# Patient Record
Sex: Male | Born: 1965 | Race: White | Hispanic: No | Smoking: Current every day smoker
Health system: Southern US, Community
[De-identification: ages and names within clinical notes are randomized; demographics above are authoritative.]

## PROBLEM LIST (undated history)

## (undated) DIAGNOSIS — E7800 Pure hypercholesterolemia, unspecified: Secondary | ICD-10-CM

## (undated) DIAGNOSIS — I10 Essential (primary) hypertension: Secondary | ICD-10-CM

## (undated) DIAGNOSIS — R7303 Prediabetes: Secondary | ICD-10-CM

## (undated) DIAGNOSIS — R768 Other specified abnormal immunological findings in serum: Secondary | ICD-10-CM

## (undated) HISTORY — DX: Other specified abnormal immunological findings in serum: R76.8

---

## 2002-12-16 ENCOUNTER — Emergency Department (HOSPITAL_COMMUNITY): Admission: EM | Admit: 2002-12-16 | Discharge: 2002-12-16 | Payer: Self-pay | Admitting: Emergency Medicine

## 2002-12-16 ENCOUNTER — Encounter: Payer: Self-pay | Admitting: Emergency Medicine

## 2003-02-03 ENCOUNTER — Emergency Department (HOSPITAL_COMMUNITY): Admission: EM | Admit: 2003-02-03 | Discharge: 2003-02-03 | Payer: Self-pay | Admitting: Emergency Medicine

## 2003-02-03 ENCOUNTER — Encounter: Payer: Self-pay | Admitting: Emergency Medicine

## 2004-02-18 ENCOUNTER — Emergency Department (HOSPITAL_COMMUNITY): Admission: EM | Admit: 2004-02-18 | Discharge: 2004-02-18 | Payer: Self-pay | Admitting: Internal Medicine

## 2005-06-22 ENCOUNTER — Emergency Department (HOSPITAL_COMMUNITY): Admission: EM | Admit: 2005-06-22 | Discharge: 2005-06-22 | Payer: Self-pay | Admitting: Emergency Medicine

## 2007-12-31 ENCOUNTER — Emergency Department (HOSPITAL_COMMUNITY): Admission: EM | Admit: 2007-12-31 | Discharge: 2007-12-31 | Payer: Self-pay | Admitting: Emergency Medicine

## 2009-03-01 ENCOUNTER — Emergency Department (HOSPITAL_COMMUNITY): Admission: EM | Admit: 2009-03-01 | Discharge: 2009-03-01 | Payer: Self-pay | Admitting: Emergency Medicine

## 2009-03-03 ENCOUNTER — Emergency Department (HOSPITAL_COMMUNITY): Admission: EM | Admit: 2009-03-03 | Discharge: 2009-03-04 | Payer: Self-pay | Admitting: Emergency Medicine

## 2009-08-15 ENCOUNTER — Emergency Department (HOSPITAL_COMMUNITY): Admission: EM | Admit: 2009-08-15 | Discharge: 2009-08-15 | Payer: Self-pay | Admitting: Emergency Medicine

## 2010-01-16 ENCOUNTER — Emergency Department (HOSPITAL_COMMUNITY): Admission: EM | Admit: 2010-01-16 | Discharge: 2010-01-16 | Payer: Self-pay | Admitting: Emergency Medicine

## 2010-02-10 ENCOUNTER — Emergency Department (HOSPITAL_COMMUNITY): Admission: EM | Admit: 2010-02-10 | Discharge: 2010-02-10 | Payer: Self-pay | Admitting: Emergency Medicine

## 2011-02-06 ENCOUNTER — Emergency Department (HOSPITAL_COMMUNITY)
Admission: EM | Admit: 2011-02-06 | Discharge: 2011-02-06 | Disposition: A | Discharge status: Home / Self Care | Payer: Self-pay | Attending: Emergency Medicine | Admitting: Emergency Medicine

## 2011-02-06 DIAGNOSIS — R109 Unspecified abdominal pain: Secondary | ICD-10-CM | POA: Insufficient documentation

## 2011-02-06 DIAGNOSIS — R197 Diarrhea, unspecified: Secondary | ICD-10-CM | POA: Insufficient documentation

## 2011-02-06 DIAGNOSIS — R10819 Abdominal tenderness, unspecified site: Secondary | ICD-10-CM | POA: Insufficient documentation

## 2011-02-06 LAB — BASIC METABOLIC PANEL
CO2: 25 mEq/L (ref 19–32)
Chloride: 103 mEq/L (ref 96–112)
Creatinine, Ser: 0.93 mg/dL (ref 0.4–1.5)
Glucose, Bld: 88 mg/dL (ref 70–99)
Potassium: 4 mEq/L (ref 3.5–5.1)
Sodium: 139 mEq/L (ref 135–145)

## 2011-02-06 LAB — HEPATIC FUNCTION PANEL
Alkaline Phosphatase: 73 U/L (ref 39–117)
Indirect Bilirubin: 0.7 mg/dL (ref 0.3–0.9)
Total Bilirubin: 0.8 mg/dL (ref 0.3–1.2)

## 2011-02-06 LAB — CBC
HCT: 39.3 % (ref 39.0–52.0)
MCV: 92.7 fL (ref 78.0–100.0)
RBC: 4.24 MIL/uL (ref 4.22–5.81)
RDW: 13.8 % (ref 11.5–15.5)

## 2011-02-06 LAB — URINALYSIS, ROUTINE W REFLEX MICROSCOPIC
Leukocytes, UA: NEGATIVE
Specific Gravity, Urine: >1.03 — ABNORMAL HIGH (ref 1.005–1.030)
pH: 6 (ref 5.0–8.0)

## 2011-02-06 LAB — DIFFERENTIAL
Basophils Relative: 0 % (ref 0–1)
Eosinophils Absolute: 0.2 10*3/uL (ref 0.0–0.7)
Eosinophils Relative: 3 % (ref 0–5)
Lymphocytes Relative: 33 % (ref 12–46)

## 2011-02-06 LAB — LIPASE, BLOOD: Lipase: 16 U/L (ref 11–59)

## 2011-02-10 LAB — STOOL CULTURE

## 2011-03-07 IMAGING — CT CT ABDOMEN W/ CM
2 of 5 series · 16 of 46 positions shown, 18 images · IV contrast (agent unspecified)
Comparison: None

CT ABDOMEN

CLINICAL DATA: Abdominal pain, nausea, vomiting, constipation
history of hepatitis C.

CT ABDOMEN AND PELVIS WITH CONTRAST
TECHNIQUE: Multidetector CT imaging of the abdomen and pelvis was
performed using the standard protocol following bolus
administration of intravenous contrast.
Contrast: 100 ml Wmnipaque-255 IV

[Series 2: abd_pel_with 5.0 b40f · axial · 0.61mm/px · z∈[-661,-241]mm · 13 of 94 slices shown, 15 images]
[im 5/94  soft-tissue]
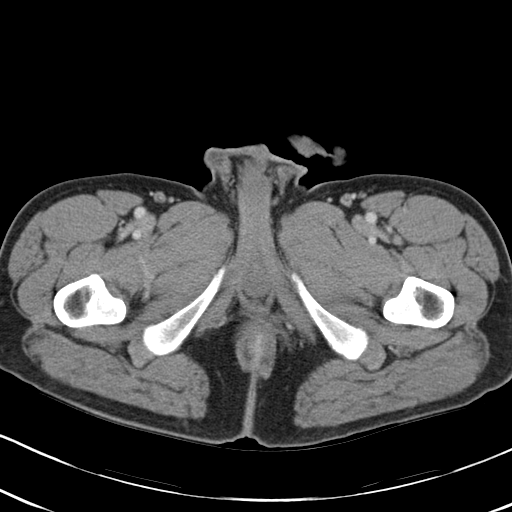
[im 5/94  bone]
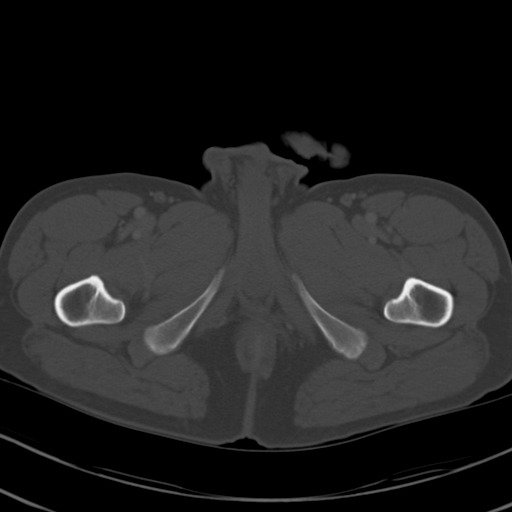
[im 15/94  soft-tissue]
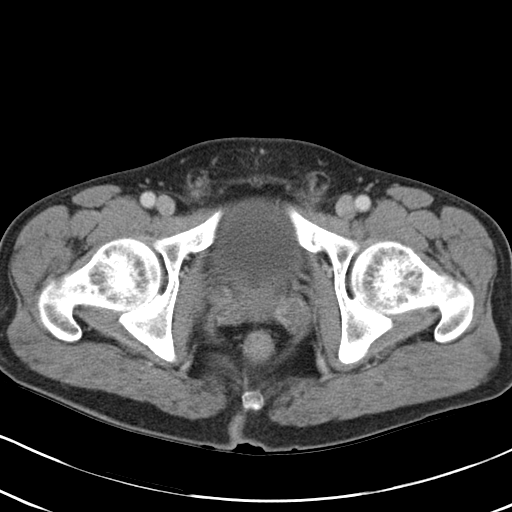
[im 20/94  soft-tissue]
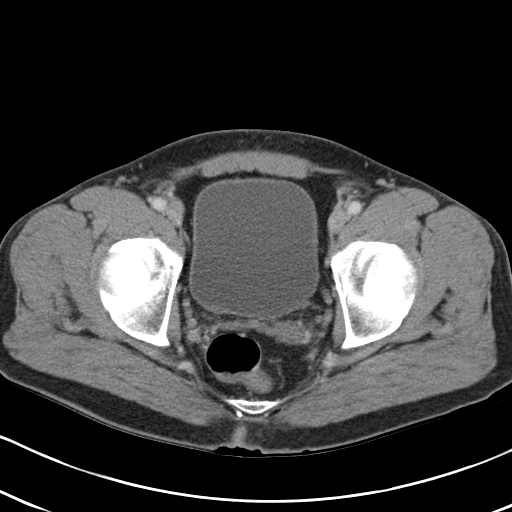
[im 25/94  soft-tissue]
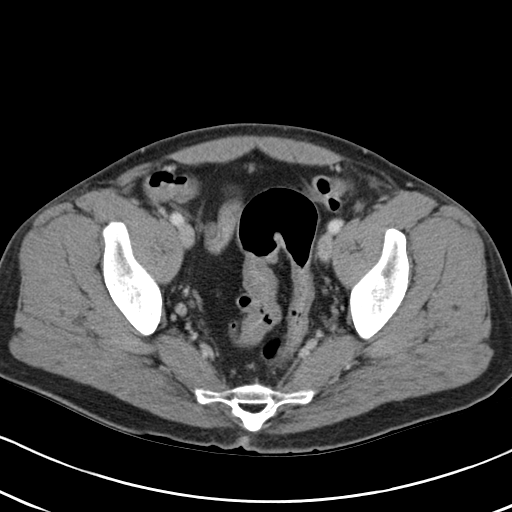
[im 35/94  soft-tissue]
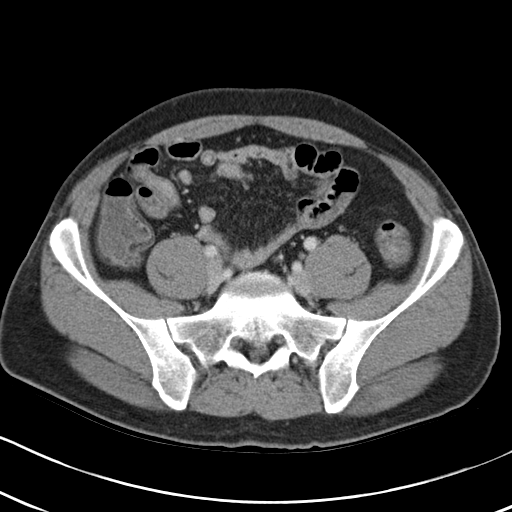
[im 40/94  soft-tissue]
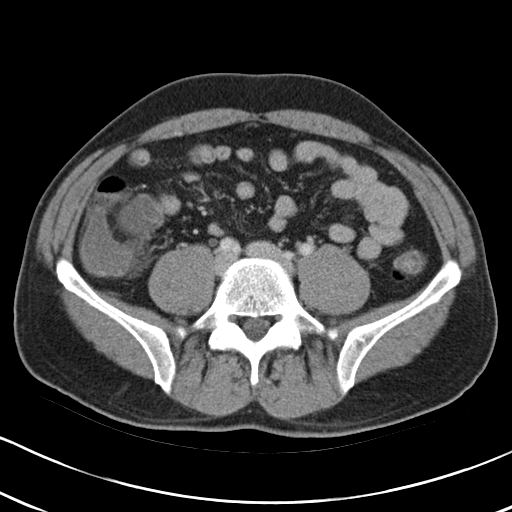
[im 49/94  soft-tissue]
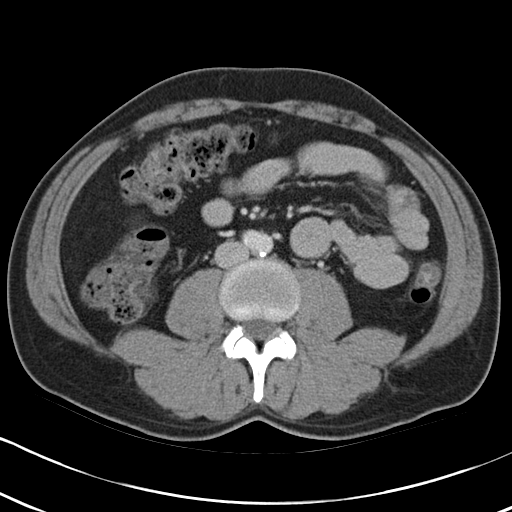
[im 54/94  soft-tissue]
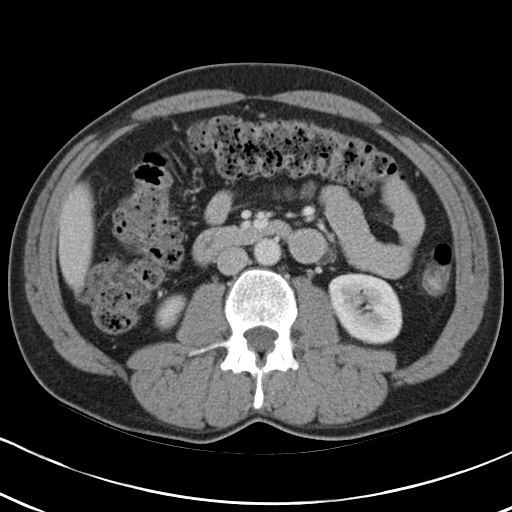
[im 59/94  soft-tissue]
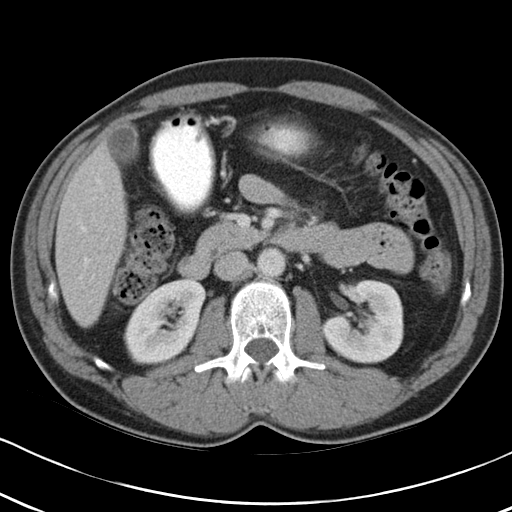
[im 59/94  bone]
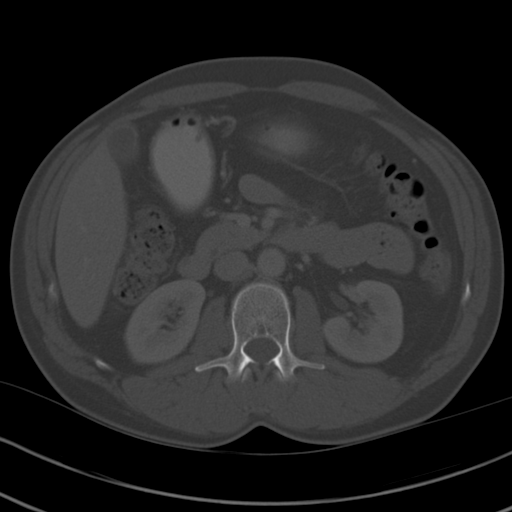
[im 69/94  soft-tissue]
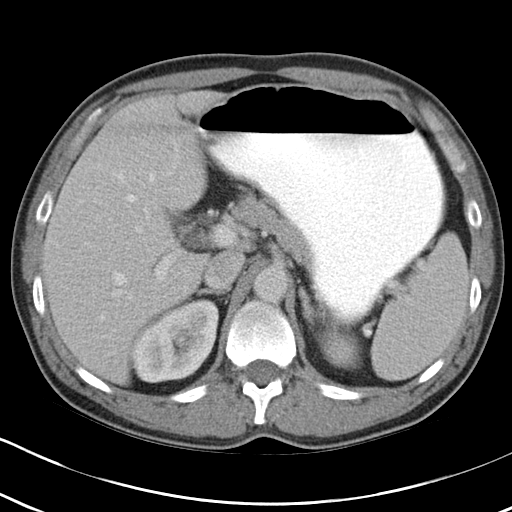
[im 74/94  soft-tissue]
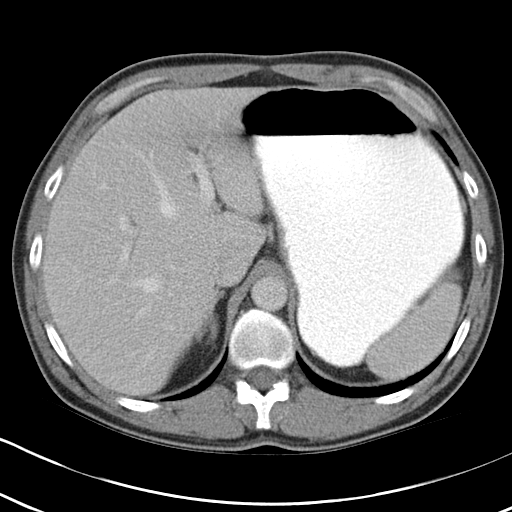
[im 79/94  soft-tissue]
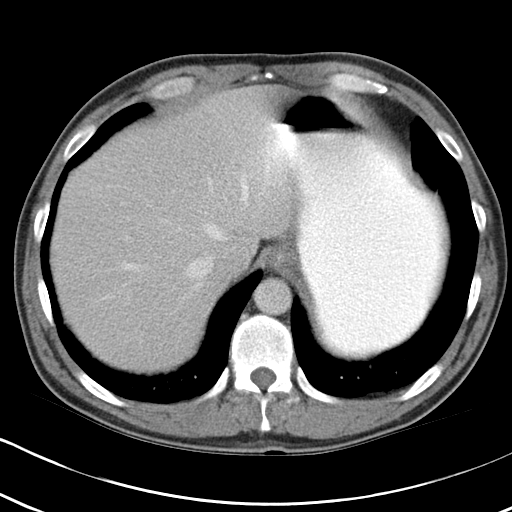
[im 89/94  soft-tissue]
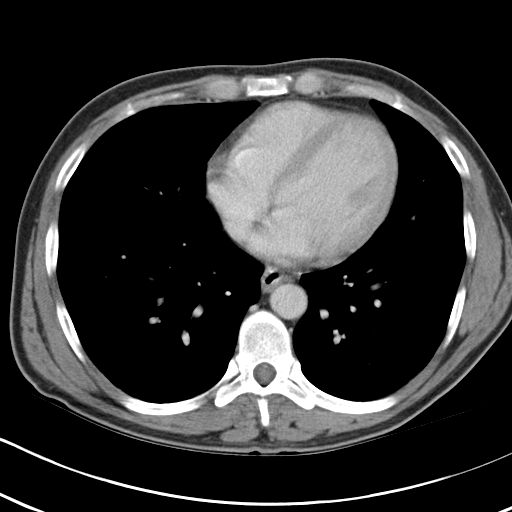

[Series 4: mpr cor post contrast (id) · coronal · 0.64mm/px · 3 of 70 slices shown]
[im 24/70  soft-tissue]
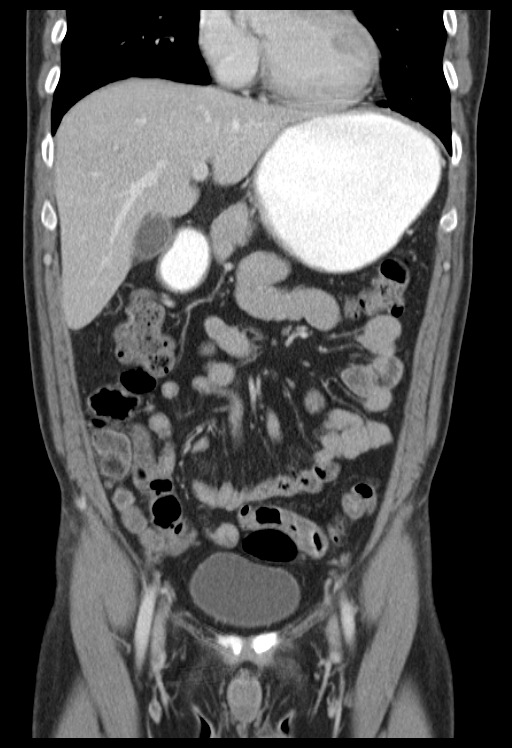
[im 31/70  soft-tissue]
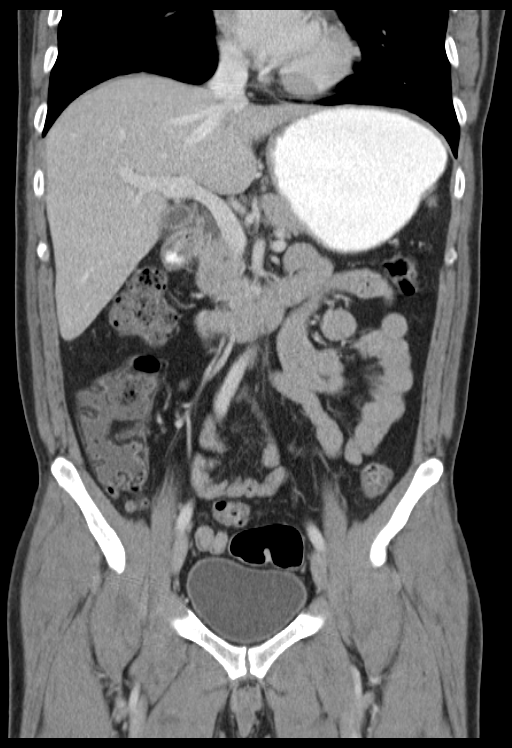
[im 39/70  soft-tissue]
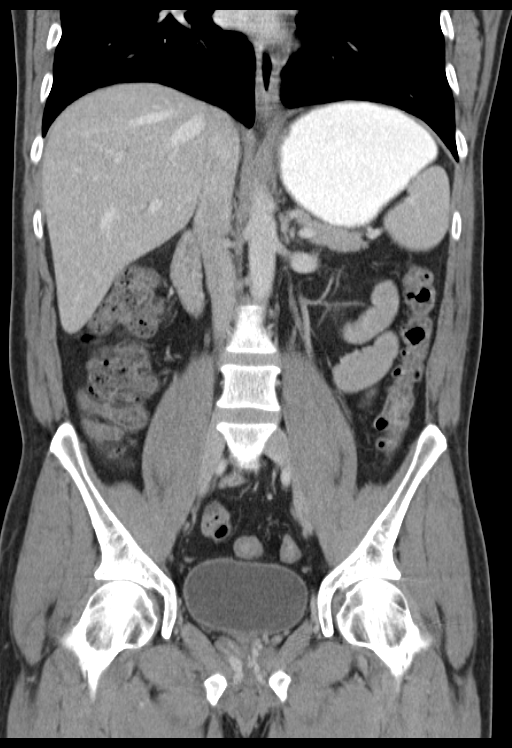

[16 of 46 positions shown; findings below may reference images not displayed]

FINDINGS: Increased stool burden in the colon particularly the
right colon.  No unusual bowel wall thickening.  No free fluid or
free air.Normal appendix.  Negative for bowel obstruction.  Normal
gallbladder.  Liver spleen, pancreas, kidneys, adrenal glands
appear unremarkable.
IMPRESSION: Increase stool burden.  No acute findings.

CT PELVIS
FINDINGS: No pelvic mass or free fluid.  Rectosigmoid
unremarkable.  There are few sigmoid colon diverticula but no
evidence of diverticulitis.
IMPRESSION: Unremarkable CT pelvis.

## 2011-03-09 LAB — CBC
Hemoglobin: 15.3 g/dL (ref 13.0–17.0)
MCHC: 34.6 g/dL (ref 30.0–36.0)
MCV: 94.1 fL (ref 78.0–100.0)
Platelets: 294 10*3/uL (ref 150–400)
RBC: 4.71 MIL/uL (ref 4.22–5.81)
RDW: 13.7 % (ref 11.5–15.5)
WBC: 11.9 10*3/uL — ABNORMAL HIGH (ref 4.0–10.5)

## 2011-03-09 LAB — COMPREHENSIVE METABOLIC PANEL
AST: 19 U/L (ref 0–37)
Alkaline Phosphatase: 99 U/L (ref 39–117)
BUN: 9 mg/dL (ref 6–23)
Chloride: 97 mEq/L (ref 96–112)
Creatinine, Ser: 0.96 mg/dL (ref 0.4–1.5)
Glucose, Bld: 134 mg/dL — ABNORMAL HIGH (ref 70–99)

## 2011-03-09 LAB — URINALYSIS, ROUTINE W REFLEX MICROSCOPIC
Glucose, UA: NEGATIVE mg/dL
Hgb urine dipstick: NEGATIVE
Ketones, ur: NEGATIVE mg/dL
Nitrite: NEGATIVE
Specific Gravity, Urine: 1.01 (ref 1.005–1.030)

## 2011-03-09 LAB — DIFFERENTIAL
Basophils Relative: 0 % (ref 0–1)
Lymphs Abs: 2.3 10*3/uL (ref 0.7–4.0)
Monocytes Absolute: 0.6 10*3/uL (ref 0.1–1.0)
Monocytes Relative: 5 % (ref 3–12)
Neutro Abs: 8.8 10*3/uL — ABNORMAL HIGH (ref 1.7–7.7)

## 2011-03-14 LAB — COMPREHENSIVE METABOLIC PANEL
ALT: 16 U/L (ref 0–53)
AST: 20 U/L (ref 0–37)
Albumin: 4.1 g/dL (ref 3.5–5.2)
Alkaline Phosphatase: 93 U/L (ref 39–117)
Calcium: 9.3 mg/dL (ref 8.4–10.5)
GFR calc non Af Amer: >60 mL/min (ref >60)
Glucose, Bld: 122 mg/dL — ABNORMAL HIGH (ref 70–99)
Sodium: 136 mEq/L (ref 135–145)
Total Bilirubin: 0.6 mg/dL (ref 0.3–1.2)
Total Protein: 7.1 g/dL (ref 6.0–8.3)

## 2011-03-14 LAB — CBC
HCT: 43.2 % (ref 39.0–52.0)
MCHC: 34.2 g/dL (ref 30.0–36.0)
MCV: 93.9 fL (ref 78.0–100.0)
Platelets: 296 10*3/uL (ref 150–400)
RDW: 13.6 % (ref 11.5–15.5)

## 2011-03-14 LAB — DIFFERENTIAL
Basophils Absolute: 0.1 10*3/uL (ref 0.0–0.1)
Basophils Relative: 1 % (ref 0–1)
Eosinophils Absolute: 0.2 10*3/uL (ref 0.0–0.7)
Neutro Abs: 7.3 10*3/uL (ref 1.7–7.7)
Neutrophils Relative %: 69 % (ref 43–77)

## 2011-03-14 LAB — URINALYSIS, ROUTINE W REFLEX MICROSCOPIC
Hgb urine dipstick: NEGATIVE
Protein, ur: NEGATIVE mg/dL
Specific Gravity, Urine: 1.02 (ref 1.005–1.030)
pH: 7 (ref 5.0–8.0)

## 2011-03-15 LAB — URINALYSIS, ROUTINE W REFLEX MICROSCOPIC
Glucose, UA: NEGATIVE mg/dL
Protein, ur: NEGATIVE mg/dL
pH: 7 (ref 5.0–8.0)

## 2011-03-15 LAB — URINE CULTURE
Colony Count: NO GROWTH
Culture: NO GROWTH

## 2011-03-15 LAB — CBC
HCT: 44.8 % (ref 39.0–52.0)
Hemoglobin: 15.3 g/dL (ref 13.0–17.0)
MCHC: 34.2 g/dL (ref 30.0–36.0)
RDW: 13.9 % (ref 11.5–15.5)

## 2011-03-15 LAB — POCT CARDIAC MARKERS: CKMB, poc: <1 ng/mL — ABNORMAL LOW (ref 1.0–8.0)

## 2011-03-15 LAB — COMPREHENSIVE METABOLIC PANEL
BUN: 6 mg/dL (ref 6–23)
Calcium: 8.4 mg/dL (ref 8.4–10.5)
Glucose, Bld: 102 mg/dL — ABNORMAL HIGH (ref 70–99)
Total Protein: 5.9 g/dL — ABNORMAL LOW (ref 6.0–8.3)

## 2011-03-15 LAB — DIFFERENTIAL
Lymphs Abs: 3.6 10*3/uL (ref 0.7–4.0)
Monocytes Relative: 5 % (ref 3–12)
Neutro Abs: 7.8 10*3/uL — ABNORMAL HIGH (ref 1.7–7.7)
Neutrophils Relative %: 62 % (ref 43–77)

## 2015-06-15 ENCOUNTER — Telehealth: Payer: Self-pay | Admitting: General Practice

## 2015-06-23 NOTE — Telephone Encounter (Signed)
Patient has [place]. Patient is not on any medications. Appointment given for 07/07/2015 @ 1:25pm.

## 2015-07-07 ENCOUNTER — Encounter: Payer: Self-pay | Admitting: Family Medicine

## 2015-07-07 ENCOUNTER — Encounter (INDEPENDENT_AMBULATORY_CARE_PROVIDER_SITE_OTHER): Payer: Self-pay

## 2015-07-07 ENCOUNTER — Ambulatory Visit (INDEPENDENT_AMBULATORY_CARE_PROVIDER_SITE_OTHER): Payer: PRIVATE HEALTH INSURANCE | Admitting: Family Medicine

## 2015-07-07 VITALS — BP 155/99 | HR 82 | Temp 97.5°F | Ht 66.0 in | Wt 150.4 lb

## 2015-07-07 DIAGNOSIS — I10 Essential (primary) hypertension: Secondary | ICD-10-CM | POA: Insufficient documentation

## 2015-07-07 DIAGNOSIS — R894 Abnormal immunological findings in specimens from other organs, systems and tissues: Secondary | ICD-10-CM

## 2015-07-07 DIAGNOSIS — Z720 Tobacco use: Secondary | ICD-10-CM | POA: Diagnosis not present

## 2015-07-07 DIAGNOSIS — R768 Other specified abnormal immunological findings in serum: Secondary | ICD-10-CM

## 2015-07-07 DIAGNOSIS — Z0000 Encounter for general adult medical examination without abnormal findings: Secondary | ICD-10-CM

## 2015-07-07 DIAGNOSIS — R030 Elevated blood-pressure reading, without diagnosis of hypertension: Secondary | ICD-10-CM

## 2015-07-07 NOTE — Assessment & Plan Note (Signed)
Unsure if he has had high blood pressure in the past, back in one month with a long and recheck Likely start Prinzide then

## 2015-07-07 NOTE — Assessment & Plan Note (Signed)
Counseled to quit, offered clinical pharmacist and pharmacologic support Patient to think about it

## 2015-07-07 NOTE — Assessment & Plan Note (Signed)
Relatively healthy 49 year old male, discussed preventative healthcare maintenance and smoking cessation.

## 2015-07-07 NOTE — Assessment & Plan Note (Signed)
Father died of colon cancer, several her cancer related deaths in the family that he doesn't know what type Colonoscopy, GI referral ordered HIV test-screening

## 2015-07-07 NOTE — Patient Instructions (Addendum)
Great to meet you!  Lets follow up in 1 month to discuss your blood pressure, try to write down a few numbers for me this month.   We will let you know about your labs  You should hear from [place] in the next week or so about a colonoscopy

## 2015-07-07 NOTE — Progress Notes (Signed)
Patient ID: George Estes, male   DOB: 1966-08-07, 49 y.o.   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 He has been divorced for about 13 years. Occasionally he has crying spells and he feels depressed for a few hours at a time 1-2 times per week.  Mood As above has crying spells, also endorses decreased sleep, anhedonia, depressed feelings When discussed these feelings and episodes come on and last 30 minutes to a few hours and happen 1-2 times per week. He denies suicidal ideation He would not like to take medications for this at this time.  Tobacco use Current pack a day smoker Has tried to quit before, was unsuccessful  Hepatitis C antibody positive, he states this was done at [facility]. He has never had further workup.  PMH: Smoking status noted Past family, surgical, and social history reviewed and updated in relevant portions of EMR   ROS:   No chest pain, dyspnea, palpitations No leg edema Positive psych symptoms as above Otherwise negative    Objective: BP 155/99 mmHg  Pulse 82  Temp(Src) 97.5 F (36.4 C) (Oral)  Ht 5' 6"  (1.676 m)  Wt 150 lb 6.4 oz (68.221 kg)  BMI 24.29 kg/m2 Gen: NAD, alert, cooperative with exam HEENT: NCAT, TM 70 and LDL, oropharynx clear, nares clear Neck supple, no lymphadenopathy CV: RRR, good S1/S2, no murmur Resp: CTABL, no wheezes, non-labored Abd: SNTND, BS present, no guarding or organomegaly Ext: No edema, warm Neuro: Alert and oriented, No gross deficits Rectal: He declines Skin: No rash  Assessment and plan:  Tobacco abuse Counseled to quit, offered clinical pharmacist and pharmacologic support Patient to think about it  Healthcare maintenance Father died of colon cancer, several her cancer related deaths in the family that he doesn't know what type Colonoscopy, GI referral ordered HIV test-screening   Elevated blood  pressure reading without diagnosis of hypertension Unsure if he has had high blood pressure in the past, back in one month with a long and recheck Likely start Prinzide then  Annual physical exam Relatively healthy 49 year old male, discussed preventative healthcare maintenance and smoking cessation.  Hepatitis C antibody test positive He states that he is hepatitis C antibody positive, however can't verify this is a cannot pull up his chart in care everywhere. Recheck antibody with reflex RNA Quant   Orders Placed This Encounter  Procedures  . CBC with Differential  . CMP14+EGFR  . Hepatitis C Ab Reflex HCV RNA, QUANT  . HIV antibody  . Lipid Panel  . Ambulatory referral to Gastroenterology    Referral Priority:  Routine    Referral Type:  Consultation    Referral Reason:  Specialty Services Required    Number of Visits Requested:  1

## 2015-07-07 NOTE — Assessment & Plan Note (Signed)
He states that he is hepatitis C antibody positive, however can't verify this is a cannot pull up his chart in care everywhere. Recheck antibody with reflex RNA Quant

## 2015-07-11 ENCOUNTER — Encounter (INDEPENDENT_AMBULATORY_CARE_PROVIDER_SITE_OTHER): Payer: Self-pay | Admitting: *Deleted

## 2015-07-13 LAB — CMP14+EGFR
ALK PHOS: 98 IU/L (ref 39–117)
ALT: 30 IU/L (ref 0–44)
AST: 17 IU/L (ref 0–40)
Albumin/Globulin Ratio: 1.8 (ref 1.1–2.5)
Albumin: 4.5 g/dL (ref 3.5–5.5)
BUN/Creatinine Ratio: 12 (ref 9–20)
BUN: 9 mg/dL (ref 6–24)
Bilirubin Total: 0.6 mg/dL (ref 0.0–1.2)
CALCIUM: 9 mg/dL (ref 8.7–10.2)
CHLORIDE: 98 mmol/L (ref 97–108)
CO2: 21 mmol/L (ref 18–29)
CREATININE: 0.77 mg/dL (ref 0.76–1.27)
GFR calc Af Amer: 122 mL/min/{1.73_m2} (ref >59)
GFR calc non Af Amer: 106 mL/min/{1.73_m2} (ref >59)
GLOBULIN, TOTAL: 2.5 g/dL (ref 1.5–4.5)
Glucose: 92 mg/dL (ref 65–99)
POTASSIUM: 3.7 mmol/L (ref 3.5–5.2)
SODIUM: 139 mmol/L (ref 134–144)
Total Protein: 7 g/dL (ref 6.0–8.5)

## 2015-07-13 LAB — CBC WITH DIFFERENTIAL/PLATELET
BASOS: 0 %
Basophils Absolute: 0 10*3/uL (ref 0.0–0.2)
EOS (ABSOLUTE): 0.1 10*3/uL (ref 0.0–0.4)
EOS: 2 %
HEMATOCRIT: 42.8 % (ref 37.5–51.0)
Hemoglobin: 14.7 g/dL (ref 12.6–17.7)
IMMATURE GRANS (ABS): 0 10*3/uL (ref 0.0–0.1)
Immature Granulocytes: 0 %
LYMPHS ABS: 2.3 10*3/uL (ref 0.7–3.1)
LYMPHS: 29 %
MCH: 32.2 pg (ref 26.6–33.0)
MCHC: 34.3 g/dL (ref 31.5–35.7)
MCV: 94 fL (ref 79–97)
MONOCYTES: 6 %
Monocytes Absolute: 0.5 10*3/uL (ref 0.1–0.9)
NEUTROS ABS: 4.8 10*3/uL (ref 1.4–7.0)
NEUTROS PCT: 63 %
Platelets: 271 10*3/uL (ref 150–379)
RBC: 4.57 x10E6/uL (ref 4.14–5.80)
RDW: 14.2 % (ref 12.3–15.4)
WBC: 7.8 10*3/uL (ref 3.4–10.8)

## 2015-07-13 LAB — LIPID PANEL
CHOLESTEROL TOTAL: 248 mg/dL — AB (ref 100–199)
Chol/HDL Ratio: 6.5 ratio units — ABNORMAL HIGH (ref 0.0–5.0)
HDL: 38 mg/dL — ABNORMAL LOW (ref >39)
LDL CALC: 147 mg/dL — AB (ref 0–99)
Triglycerides: 313 mg/dL — ABNORMAL HIGH (ref 0–149)
VLDL Cholesterol Cal: 63 mg/dL — ABNORMAL HIGH (ref 5–40)

## 2015-07-13 LAB — HCV RNA PCR QN RFX NS3/4A: HEPATITIS C QUANTITATION: NOT DETECTED [IU]/mL

## 2015-07-13 LAB — HIV ANTIBODY (ROUTINE TESTING W REFLEX): HIV SCREEN 4TH GENERATION: NONREACTIVE

## 2015-08-01 ENCOUNTER — Telehealth: Payer: Self-pay | Admitting: Family Medicine

## 2015-08-01 NOTE — Telephone Encounter (Addendum)
Left message to return call. His hep C result was negative so I'm not sure why he's concerned.

## 2015-08-09 ENCOUNTER — Other Ambulatory Visit (INDEPENDENT_AMBULATORY_CARE_PROVIDER_SITE_OTHER): Payer: Self-pay | Admitting: *Deleted

## 2015-08-09 DIAGNOSIS — Z800 Family history of malignant neoplasm of digestive organs: Secondary | ICD-10-CM

## 2015-08-09 DIAGNOSIS — Z1211 Encounter for screening for malignant neoplasm of colon: Secondary | ICD-10-CM

## 2015-08-11 ENCOUNTER — Ambulatory Visit (INDEPENDENT_AMBULATORY_CARE_PROVIDER_SITE_OTHER): Payer: PRIVATE HEALTH INSURANCE | Admitting: Family Medicine

## 2015-08-11 ENCOUNTER — Encounter: Payer: Self-pay | Admitting: Family Medicine

## 2015-08-11 VITALS — BP 164/96 | HR 82 | Temp 97.2°F | Ht 66.0 in | Wt 147.8 lb

## 2015-08-11 DIAGNOSIS — R768 Other specified abnormal immunological findings in serum: Secondary | ICD-10-CM

## 2015-08-11 DIAGNOSIS — I10 Essential (primary) hypertension: Secondary | ICD-10-CM

## 2015-08-11 DIAGNOSIS — R1013 Epigastric pain: Secondary | ICD-10-CM | POA: Insufficient documentation

## 2015-08-11 DIAGNOSIS — E785 Hyperlipidemia, unspecified: Secondary | ICD-10-CM

## 2015-08-11 DIAGNOSIS — R894 Abnormal immunological findings in specimens from other organs, systems and tissues: Secondary | ICD-10-CM | POA: Diagnosis not present

## 2015-08-11 MED ORDER — ATORVASTATIN CALCIUM 20 MG PO TABS: 20.0000 mg | ORAL_TABLET | Freq: Every day | ORAL | Status: DC

## 2015-08-11 MED ORDER — AMLODIPINE BESYLATE 5 MG PO TABS: 5.0000 mg | ORAL_TABLET | Freq: Every day | ORAL | Status: DC

## 2015-08-11 NOTE — Patient Instructions (Signed)
Great to see you!  Come back in 3 months to re-check your cholesterol and see how you are doing

## 2015-08-11 NOTE — Progress Notes (Signed)
   HPI  Patient presents today for follow-up blood pressure, epigastric pain, hyperlipidemia  Blood pressure He is not able to check his blood pressure home No chest pain, dyspnea, palpitations, leg edema.  Hyperlipidemia His cholesterol came back to be elevated, we discussed the side effects of statins and watching his diet. He is not confident that he can make good diet changes and would like to start statin.  Epigastric pain. He has intermittent episodes the last several days of sharp epigastric abdominal pain worse with movement Unsure if it's worsened with eating He does have loose stools during these episodes but does not have overt diarrhea. Denies drinking alcohol, however he does persistently smoke. No alleviating factors besides pushing down in his epigastric area  PMH: Smoking status noted ROS: Per HPI  Objective: BP 164/96 mmHg  Pulse 82  Temp(Src) 97.2 F (36.2 C) (Oral)  Ht 5\' 6"  (1.676 m)  Wt 147 lb 12.8 oz (67.042 kg)  BMI 23.87 kg/m2 Gen: NAD, alert, cooperative with exam HEENT: NCAT CV: RRR, good S1/S2, no murmur Resp: CTABL, no wheezes, non-labored Abd: Mild right upper quadrant and midepigastric tenderness to palpation, positive bowel sounds, no scars, no guarding Ext: No edema, warm Neuro: Alert and oriented, No gross deficits  Assessment and plan:  # Elevated blood pressure, hypertension Now measured on 2 different occasions, even elevated after waiting 10 minutes and the room. Start amlodipine He works in a warehouse and frequently gets dehydrated so will avoid diuretics as well as ace inhibitors for now.  # Hyperlipidemia Discussed diet Start statin Recheck lipids and liver function in 3 months  # Hepatitis C antibody Reports previous positive tests, however our test was negative Discussed, monitor liver function annually   #Epigastric pain Labs normal, previous workup for similar episodes have included ultrasound and a CT scan at other  institutions which were negative. Considering location I do suspect pancreatitis, however he does not drink alcohol. Check lipase with recent episode Colonoscopy scheduled  Orders Placed This Encounter  Procedures  . Lipase    Meds ordered this encounter  Medications  . amLODipine (NORVASC) 5 MG tablet    Sig: Take 1 tablet (5 mg total) by mouth daily.    Dispense:  90 tablet    Refill:  3  . atorvastatin (LIPITOR) 20 MG tablet    Sig: Take 1 tablet (20 mg total) by mouth daily.    Dispense:  90 tablet    Refill:  [place], MD [facility] 08/11/2015, 5:09 PM

## 2015-08-18 NOTE — Telephone Encounter (Signed)
Pt's concerns discussed at OV of 08/11/2015

## 2015-08-18 NOTE — Telephone Encounter (Signed)
Pt has been seen since this

## 2015-09-16 ENCOUNTER — Encounter (HOSPITAL_COMMUNITY): Payer: Self-pay | Admitting: Emergency Medicine

## 2015-09-16 ENCOUNTER — Emergency Department (HOSPITAL_COMMUNITY): Payer: PRIVATE HEALTH INSURANCE

## 2015-09-16 ENCOUNTER — Emergency Department (HOSPITAL_COMMUNITY)
Admission: EM | Admit: 2015-09-16 | Discharge: 2015-09-16 | Disposition: A | Discharge status: Home / Self Care | Payer: PRIVATE HEALTH INSURANCE | Attending: Emergency Medicine | Admitting: Emergency Medicine

## 2015-09-16 DIAGNOSIS — Z720 Tobacco use: Secondary | ICD-10-CM | POA: Insufficient documentation

## 2015-09-16 DIAGNOSIS — R202 Paresthesia of skin: Secondary | ICD-10-CM

## 2015-09-16 DIAGNOSIS — Y998 Other external cause status: Secondary | ICD-10-CM | POA: Diagnosis not present

## 2015-09-16 DIAGNOSIS — Y9289 Other specified places as the place of occurrence of the external cause: Secondary | ICD-10-CM | POA: Insufficient documentation

## 2015-09-16 DIAGNOSIS — Y9389 Activity, other specified: Secondary | ICD-10-CM | POA: Diagnosis not present

## 2015-09-16 DIAGNOSIS — W108XXA Fall (on) (from) other stairs and steps, initial encounter: Secondary | ICD-10-CM | POA: Insufficient documentation

## 2015-09-16 DIAGNOSIS — E7800 Pure hypercholesterolemia, unspecified: Secondary | ICD-10-CM | POA: Insufficient documentation

## 2015-09-16 DIAGNOSIS — Z8619 Personal history of other infectious and parasitic diseases: Secondary | ICD-10-CM | POA: Diagnosis not present

## 2015-09-16 DIAGNOSIS — R200 Anesthesia of skin: Secondary | ICD-10-CM | POA: Insufficient documentation

## 2015-09-16 DIAGNOSIS — S3992XA Unspecified injury of lower back, initial encounter: Secondary | ICD-10-CM | POA: Diagnosis not present

## 2015-09-16 DIAGNOSIS — M545 Low back pain: Secondary | ICD-10-CM

## 2015-09-16 DIAGNOSIS — I10 Essential (primary) hypertension: Secondary | ICD-10-CM | POA: Diagnosis not present

## 2015-09-16 DIAGNOSIS — W19XXXA Unspecified fall, initial encounter: Secondary | ICD-10-CM

## 2015-09-16 DIAGNOSIS — Z79899 Other long term (current) drug therapy: Secondary | ICD-10-CM | POA: Diagnosis not present

## 2015-09-16 HISTORY — DX: Pure hypercholesterolemia, unspecified: E78.00

## 2015-09-16 HISTORY — DX: Essential (primary) hypertension: I10

## 2015-09-16 MED ORDER — PREDNISONE 10 MG PO TABS: ORAL_TABLET | ORAL | Status: DC

## 2015-09-16 MED ORDER — HYDROCODONE-ACETAMINOPHEN 5-325 MG PO TABS: 1.0000 | ORAL_TABLET | ORAL | Status: DC | PRN

## 2015-09-16 MED ORDER — PREDNISONE 10 MG PO TABS
60.0000 mg | ORAL_TABLET | Freq: Once | ORAL | Status: AC
  Administered 2015-09-16: 60 mg via ORAL
  Filled 2015-09-16 (×2): qty 1

## 2015-09-16 MED ORDER — CYCLOBENZAPRINE HCL 5 MG PO TABS
5.0000 mg | ORAL_TABLET | Freq: Three times a day (TID) | ORAL | Status: DC | PRN
Start: 2015-09-16 — End: 2016-01-16

## 2015-09-16 NOTE — Discharge Instructions (Signed)
Take your next dose of prednisone tomorrow morning.  May use the other medications as prescribed, these may make you drowsy, do not drive within 4 hours of taking hydrocodone or Flexeril.  Apply ice as much as possible to your lower back for the next 2 days.  You may add a heating pad starting on Monday 20 minutes 3 times daily.  As discussed you should get immediate recheck for any weakness in either lower extremity.  He should also get rechecked if the numbness is not improving over the next several days as you're inflammation is treated.  Please plan follow-up with your primary doctor for any persistent symptoms.  Return here over the weekend if you have any worsening symptoms, specifically weakness as discussed.   Paresthesia Paresthesia is an abnormal burning or prickling sensation. This sensation is generally felt in the hands, arms, legs, or feet. However, it may occur in any part of the body. Usually, it is not painful. The feeling may be described as:  Tingling or numbness.  Pins and needles.  Skin crawling.  Buzzing.  Limbs falling asleep.  Itching. Most people experience temporary (transient) paresthesia at some time in their lives. Paresthesia may occur when you breathe too quickly (hyperventilation). It can also occur without any apparent cause. Commonly, paresthesia occurs when pressure is placed on a nerve. The sensation quickly goes away after the pressure is removed. For some people, however, paresthesia is a long-lasting (chronic) condition that is caused by an underlying disorder. If you continue to have paresthesia, you may need further medical evaluation. HOME CARE INSTRUCTIONS Watch your condition for any changes. Taking the following actions may help to lessen any discomfort that you are feeling:  Avoid drinking alcohol.  Try acupuncture or massage to help relieve your symptoms.  Keep all follow-up visits as directed by your health care provider. This is  important. SEEK MEDICAL CARE IF:  You continue to have episodes of paresthesia.  Your burning or prickling feeling gets worse when you walk.  You have pain, cramps, or dizziness.  You develop a rash. SEEK IMMEDIATE MEDICAL CARE IF:  You feel weak.  You have trouble walking or moving.  You have problems with speech, understanding, or vision.  You feel confused.  You cannot control your bladder or bowel movements.  You have numbness after an injury.  You faint.   This information is not intended to replace advice given to you by your health care provider. Make sure you discuss any questions you have with your health care provider.   Document Released: 11/09/2002 Document Revised: 04/05/2015 Document Reviewed: 11/15/2014 Elsevier Interactive Patient Education 2016 Elsevier Inc.  Back Pain, Adult Back pain is very common in adults.The cause of back pain is rarely dangerous and the pain often gets better over time.The cause of your back pain may not be known. Some common causes of back pain include:  Strain of the muscles or ligaments supporting the spine.  Wear and tear (degeneration) of the spinal disks.  Arthritis.  Direct injury to the back. For many people, back pain may return. Since back pain is rarely dangerous, most people can learn to manage this condition on their own. HOME CARE INSTRUCTIONS Watch your back pain for any changes. The following actions may help to lessen any discomfort you are feeling:  Remain active. It is stressful on your back to sit or stand in one place for long periods of time. Do not sit, drive, or stand in one place for  more than 30 minutes at a time. Take short walks on even surfaces as soon as you are able.Try to increase the length of time you walk each day.  Exercise regularly as directed by your health care provider. Exercise helps your back heal faster. It also helps avoid future injury by keeping your muscles strong and  flexible.  Do not stay in bed.Resting more than 1-2 days can delay your recovery.  Pay attention to your body when you bend and lift. The most comfortable positions are those that put less stress on your recovering back. Always use proper lifting techniques, including:  Bending your knees.  Keeping the load close to your body.  Avoiding twisting.  Find a comfortable position to sleep. Use a firm mattress and lie on your side with your knees slightly bent. If you lie on your back, put a pillow under your knees.  Avoid feeling anxious or stressed.Stress increases muscle tension and can worsen back pain.It is important to recognize when you are anxious or stressed and learn ways to manage it, such as with exercise.  Take medicines only as directed by your health care provider. Over-the-counter medicines to reduce pain and inflammation are often the most helpful.Your health care provider may prescribe muscle relaxant drugs.These medicines help dull your pain so you can more quickly return to your normal activities and healthy exercise.  Apply ice to the injured area:  Put ice in a plastic bag.  Place a towel between your skin and the bag.  Leave the ice on for 20 minutes, 2-3 times a day for the first 2-3 days. After that, ice and heat may be alternated to reduce pain and spasms.  Maintain a healthy weight. Excess weight puts extra stress on your back and makes it difficult to maintain good posture. SEEK MEDICAL CARE IF:  You have pain that is not relieved with rest or medicine.  You have increasing pain going down into the legs or buttocks.  You have pain that does not improve in one week.  You have night pain.  You lose weight.  You have a fever or chills. SEEK IMMEDIATE MEDICAL CARE IF:   You develop new bowel or bladder control problems.  You have unusual weakness or numbness in your arms or legs.  You develop nausea or vomiting.  You develop abdominal  pain.  You feel faint.   This information is not intended to replace advice given to you by your health care provider. Make sure you discuss any questions you have with your health care provider.   Document Released: 11/19/2005 Document Revised: 12/10/2014 Document Reviewed: 03/23/2014 Elsevier Interactive Patient Education [employer].

## 2015-09-16 NOTE — ED Notes (Signed)
Pt c/o mid back pain after slipping and falling on porch steps. Pt ambulatory in triage. nad noted.

## 2015-09-17 NOTE — ED Provider Notes (Signed)
CSN: [id number]     Arrival date & time 09/16/15  1343 History   First MD Initiated Contact with Patient 09/16/15 1442     Chief Complaint  Patient presents with  . Back Pain     (Consider location/radiation/quality/duration/timing/severity/associated sxs/prior Treatment) The history is provided by the patient.   George Estes is a 49 y.o. male  Presenting with persistent pain in his mid to lower back after tripping down a 5 step outside stoop, landing in grass directly on his lower back several hours before arriving here. He reports resting and using ice without relief of pain which is worsened with movement and palpation.  He also notes a tingling/numb sensation to his left lateral upper leg, but denies weakness with weight bearing or any urinary or fecal incontinence.  He denies hitting his head or loc,denies nausea, vomiting, dizziness or confusion since the event.  He denies history of back pain.    Past Medical History  Diagnosis Date  . Hepatitis C antibody test positive   . Hypertension   . High cholesterol    History reviewed. No pertinent past surgical history. Family History  Problem Relation Age of Onset  . Cancer Father     colon  . Cancer Maternal Uncle   . Cancer Maternal Grandfather    Social History  Substance Use Topics  . Smoking status: Current Every Day Smoker -- 1.00 packs/day  . Smokeless tobacco: None  . Alcohol Use: No    Review of Systems  Constitutional: Negative for fever.  Respiratory: Negative for shortness of breath.   Cardiovascular: Negative for chest pain and leg swelling.  Gastrointestinal: Negative for abdominal pain, constipation and abdominal distention.  Genitourinary: Negative for dysuria, urgency, frequency, flank pain and difficulty urinating.  Musculoskeletal: Positive for back pain. Negative for joint swelling and gait problem.  Skin: Negative for rash.  Neurological: Positive for numbness. Negative for weakness.       Allergies  Review of patient's allergies indicates no known allergies.  Home Medications   Prior to Admission medications   Medication Sig Start Date End Date Taking? Authorizing Provider  amLODipine (NORVASC) 5 MG tablet Take 1 tablet (5 mg total) by mouth daily. 08/11/15   Timmothy Euler, MD  atorvastatin (LIPITOR) 20 MG tablet Take 1 tablet (20 mg total) by mouth daily. 08/11/15   Timmothy Euler, MD  cyclobenzaprine (FLEXERIL) 5 MG tablet Take 1 tablet (5 mg total) by mouth 3 (three) times daily as needed for muscle spasms. 09/16/15   Evalee Jefferson, PA-C  HYDROcodone-acetaminophen (NORCO/VICODIN) 5-325 MG tablet Take 1 tablet by mouth every 4 (four) hours as needed. 09/16/15   Evalee Jefferson, PA-C  predniSONE (DELTASONE) 10 MG tablet 6, 5, 4, 3, 2 then 1 tablet by mouth daily for 6 days total. 09/16/15   Evalee Jefferson, PA-C   BP 133/94 mmHg  Pulse 73  Temp(Src) 97.7 F (36.5 C) (Oral)  Resp 20  Ht 5\' 5"  (1.651 m)  Wt 145 lb (65.772 kg)  BMI 24.13 kg/m2  SpO2 98% Physical Exam  Constitutional: He appears well-developed and well-nourished.  HENT:  Head: Normocephalic.  Eyes: Conjunctivae are normal.  Neck: Normal range of motion. Neck supple.  Cardiovascular: Normal rate and intact distal pulses.   Pedal pulses normal.  Pulmonary/Chest: Effort normal.  Abdominal: Soft. Bowel sounds are normal. He exhibits no distension and no mass.  Musculoskeletal: Normal range of motion. He exhibits no edema.       Lumbar  back: He exhibits tenderness. He exhibits no swelling, no edema and no spasm.  ttp upper lumbar to lower thoracic spine without edema, stepoffs, deformity. No cva ttp.  No bruising or visible sign of trauma.  Neurological: He is alert. He has normal strength. He displays no atrophy and no tremor. A sensory deficit is present. Gait normal.  Reflex Scores:      Patellar reflexes are 2+ on the right side and 2+ on the left side.      Achilles reflexes are 2+ on the right side  and 2+ on the left side. No strength deficit noted in hip and knee flexor and extensor muscle groups.  Ankle flexion and extension intact.  Sensation present in bilateral legs but reduced in left lateral thigh (pins and needles sensation).  Skin: Skin is warm and dry.  Psychiatric: He has a normal mood and affect.  Nursing note and vitals reviewed.   ED Course  Procedures (including critical care time) Labs Review Labs Reviewed - No data to display  Imaging Review Dg Thoracic Spine W/swimmers  09/16/2015  CLINICAL DATA:  49 year old male with upper and lower back pain with left leg numbness after falling off a porch. EXAM: THORACIC SPINE - 3 VIEWS COMPARISON:  No priors. FINDINGS: Three views of the thoracic spine demonstrate no definite acute displaced fractures or compression type fractures. Alignment is anatomic. Visualized portions of the thorax are unremarkable. IMPRESSION: 1. No acute radiographic abnormality of the thoracic spine. Electronically Signed   By: Vinnie Langton M.D.   On: 09/16/2015 15:03   Dg Lumbar Spine Complete  09/16/2015  CLINICAL DATA:  Upper and lower back pain. Left leg numbness. Fell down steps today. EXAM: LUMBAR SPINE - COMPLETE 4+ VIEW COMPARISON:  CT 03/03/2009 FINDINGS: There is no evidence of lumbar spine fracture. Alignment is normal. Intervertebral disc spaces are maintained. IMPRESSION: Negative. Electronically Signed   By: Rolm Baptise M.D.   On: 09/16/2015 15:01   I have personally reviewed and evaluated these images and lab results as part of my medical decision-making.   EKG Interpretation None      MDM   Final diagnoses:  Paresthesia of left leg  Left low back pain, with sciatica presence unspecified     Radiological studies were viewed, interpreted and considered during the medical decision making and disposition process. I agree with radiologists reading.  Results were also discussed with patient. Pt advised ice tx, adding heat on day  3, prednisone taper, hydrocodone, flexeril, close f/u if numbness persists or worsens.    No neuro deficit on exam or by history to suggest emergent or surgical presentation.  Strict return precautions discussed worsened sx that should prompt immediate re-evaluation including distal weakness, bowel/bladder retention/incontinence, worsened pain.           Evalee Jefferson, PA-C 09/17/15 [id number]  Nat Christen, MD 09/19/15 [id number]

## 2015-10-03 ENCOUNTER — Telehealth (INDEPENDENT_AMBULATORY_CARE_PROVIDER_SITE_OTHER): Payer: Self-pay | Admitting: *Deleted

## 2015-10-03 DIAGNOSIS — Z1211 Encounter for screening for malignant neoplasm of colon: Secondary | ICD-10-CM

## 2015-10-03 MED ORDER — PEG 3350-KCL-NA BICARB-NACL 420 G PO SOLR: 4000.0000 mL | Freq: Once | ORAL | Status: DC

## 2015-10-03 NOTE — Telephone Encounter (Signed)
Patient needs trilyte 

## 2015-10-13 ENCOUNTER — Telehealth (INDEPENDENT_AMBULATORY_CARE_PROVIDER_SITE_OTHER): Payer: Self-pay | Admitting: *Deleted

## 2015-10-13 NOTE — Telephone Encounter (Signed)
Referring MD/PCP: bradshaw   Procedure: tcs  Reason/Indication:  Screening, fam hx colon ca  Has patient had this procedure before?  no  If so, when, by whom and where?    Is there a family history of colon cancer?  Yes, father  Who?  What age when diagnosed?    Is patient diabetic?   no      Does patient have prosthetic heart valve or mechanical valve?  no  Do you have a pacemaker?  no  Has patient ever had endocarditis? no  Has patient had joint replacement within last 12 months?  no  Does patient tend to be constipated or take laxatives? no  Does patient have a history of alcohol/drug use?  no  Is patient on Coumadin, Plavix and/or Aspirin? no  Medications: see epic  Allergies: nkda  Medication Adjustment:   Procedure date & time: 11/09/15 at 830

## 2015-10-14 NOTE — Telephone Encounter (Signed)
agree

## 2015-11-09 ENCOUNTER — Ambulatory Visit (HOSPITAL_COMMUNITY)
Admission: RE | Admit: 2015-11-09 | Payer: PRIVATE HEALTH INSURANCE | Source: Ambulatory Visit | Admitting: Internal Medicine

## 2015-11-09 ENCOUNTER — Encounter (INDEPENDENT_AMBULATORY_CARE_PROVIDER_SITE_OTHER): Payer: Self-pay | Admitting: *Deleted

## 2015-11-09 SURGERY — COLONOSCOPY
Anesthesia: Moderate Sedation

## 2015-11-14 ENCOUNTER — Ambulatory Visit: Payer: PRIVATE HEALTH INSURANCE | Admitting: Family Medicine

## 2015-11-15 ENCOUNTER — Encounter: Payer: Self-pay | Admitting: Family Medicine

## 2016-01-16 ENCOUNTER — Ambulatory Visit (INDEPENDENT_AMBULATORY_CARE_PROVIDER_SITE_OTHER): Payer: Managed Care, Other (non HMO) | Admitting: Family Medicine

## 2016-01-16 ENCOUNTER — Encounter: Payer: Self-pay | Admitting: Family Medicine

## 2016-01-16 VITALS — BP 161/94 | HR 86 | Temp 98.2°F | Ht 65.0 in | Wt 150.6 lb

## 2016-01-16 DIAGNOSIS — I10 Essential (primary) hypertension: Secondary | ICD-10-CM | POA: Diagnosis not present

## 2016-01-16 DIAGNOSIS — Z720 Tobacco use: Secondary | ICD-10-CM | POA: Diagnosis not present

## 2016-01-16 DIAGNOSIS — Z0000 Encounter for general adult medical examination without abnormal findings: Secondary | ICD-10-CM

## 2016-01-16 DIAGNOSIS — E785 Hyperlipidemia, unspecified: Secondary | ICD-10-CM | POA: Diagnosis not present

## 2016-01-16 NOTE — Patient Instructions (Addendum)
Great to see you!   Try to take you lipitor and amlodipine everyday, this is the only way it will help! Sitting them next to your coffee maker or using a pill box may help.   Come back in 1 month  Call [facility] at [phone] for a new appt.

## 2016-01-16 NOTE — Progress Notes (Signed)
   HPI  Patient presents today here to discuss colonoscopy, hypertension, medication compliance.  hypertension, hyperlipidemia Poor medication compliance No chest pain, dyspnea, palpitations, leg edema Intermittent epigastric pain as before is stable  C scope Missed his appt, states he was unaware the bowel prep was at the pharmacy and didn't recognize until the day of the procedure so he did not show  PMH: Smoking status noted ROS: Per HPI  Objective: BP 161/94 mmHg  Pulse 86  Temp(Src) 98.2 F (36.8 C) (Oral)  Ht 5\' 5"  (1.651 m)  Wt 150 lb 9.6 oz (68.312 kg)  BMI 25.06 kg/m2 Gen: NAD, alert, cooperative with exam HEENT: NCAT CV: RRR, good S1/S2, no murmur Resp: CTABL, no wheezes, non-labored Ext: No edema, warm Neuro: Alert and oriented, No gross deficits  Assessment and plan:  # HTN Elevated, not taking meds regularly yet, no meds for several weeks now Recommended pill box next to the coffee maker F/u 1 month, will likely need 2 full dose meds for control  # Smoking Not ready to quit  # HLD Discussed med complaince and statins  # HCM Recommend calling back GI and apologizing for no show- needs to be rescheduled   Laroy Apple, MD [facility] 01/16/2016, 4:39 PM

## 2016-01-19 ENCOUNTER — Other Ambulatory Visit (INDEPENDENT_AMBULATORY_CARE_PROVIDER_SITE_OTHER): Payer: Self-pay | Admitting: *Deleted

## 2016-01-19 DIAGNOSIS — Z1211 Encounter for screening for malignant neoplasm of colon: Secondary | ICD-10-CM

## 2016-01-19 DIAGNOSIS — Z800 Family history of malignant neoplasm of digestive organs: Secondary | ICD-10-CM

## 2016-02-07 ENCOUNTER — Other Ambulatory Visit (INDEPENDENT_AMBULATORY_CARE_PROVIDER_SITE_OTHER): Payer: Self-pay | Admitting: *Deleted

## 2016-02-07 ENCOUNTER — Encounter (INDEPENDENT_AMBULATORY_CARE_PROVIDER_SITE_OTHER): Payer: Self-pay | Admitting: *Deleted

## 2016-02-07 MED ORDER — PEG 3350-KCL-NA BICARB-NACL 420 G PO SOLR: 4000.0000 mL | Freq: Once | ORAL | Status: DC

## 2016-02-07 NOTE — Telephone Encounter (Signed)
Patient needs trilyte 

## 2016-02-13 ENCOUNTER — Encounter: Payer: Self-pay | Admitting: Family Medicine

## 2016-02-13 ENCOUNTER — Ambulatory Visit (INDEPENDENT_AMBULATORY_CARE_PROVIDER_SITE_OTHER): Payer: Managed Care, Other (non HMO) | Admitting: Family Medicine

## 2016-02-13 VITALS — BP 133/87 | HR 84 | Temp 97.1°F | Ht 65.0 in | Wt 145.6 lb

## 2016-02-13 DIAGNOSIS — R52 Pain, unspecified: Secondary | ICD-10-CM

## 2016-02-13 DIAGNOSIS — R05 Cough: Secondary | ICD-10-CM | POA: Diagnosis not present

## 2016-02-13 DIAGNOSIS — R6883 Chills (without fever): Secondary | ICD-10-CM | POA: Diagnosis not present

## 2016-02-13 DIAGNOSIS — E785 Hyperlipidemia, unspecified: Secondary | ICD-10-CM | POA: Diagnosis not present

## 2016-02-13 DIAGNOSIS — R059 Cough, unspecified: Secondary | ICD-10-CM

## 2016-02-13 DIAGNOSIS — I10 Essential (primary) hypertension: Secondary | ICD-10-CM | POA: Diagnosis not present

## 2016-02-13 LAB — VERITOR FLU A/B WAIVED
INFLUENZA A: NEGATIVE
INFLUENZA B: NEGATIVE

## 2016-02-13 NOTE — Patient Instructions (Signed)
Great to see you  Viral Gastroenteritis Viral gastroenteritis is also known as stomach flu. This condition affects the stomach and intestinal tract. It can cause sudden diarrhea and vomiting. The illness typically lasts 3 to 8 days. Most people develop an immune response that eventually gets rid of the virus. While this natural response develops, the virus can make you quite ill. CAUSES  Many different viruses can cause gastroenteritis, such as rotavirus or noroviruses. You can catch one of these viruses by consuming contaminated food or water. You may also catch a virus by sharing utensils or other personal items with an infected person or by touching a contaminated surface. SYMPTOMS  The most common symptoms are diarrhea and vomiting. These problems can cause a severe loss of body fluids (dehydration) and a body salt (electrolyte) imbalance. Other symptoms may include:  Fever.  Headache.  Fatigue.  Abdominal pain. DIAGNOSIS  Your caregiver can usually diagnose viral gastroenteritis based on your symptoms and a physical exam. A stool sample may also be taken to test for the presence of viruses or other infections. TREATMENT  This illness typically goes away on its own. Treatments are aimed at rehydration. The most serious cases of viral gastroenteritis involve vomiting so severely that you are not able to keep fluids down. In these cases, fluids must be given through an intravenous line (IV). HOME CARE INSTRUCTIONS   Drink enough fluids to keep your urine clear or pale yellow. Drink small amounts of fluids frequently and increase the amounts as tolerated.  Ask your caregiver for specific rehydration instructions.  Avoid:  Foods high in sugar.  Alcohol.  Carbonated drinks.  Tobacco.  Juice.  Caffeine drinks.  Extremely hot or cold fluids.  Fatty, greasy foods.  Too much intake of anything at one time.  Dairy products until 24 to 48 hours after diarrhea stops.  You may  consume probiotics. Probiotics are active cultures of beneficial bacteria. They may lessen the amount and number of diarrheal stools in adults. Probiotics can be found in yogurt with active cultures and in supplements.  Wash your hands well to avoid spreading the virus.  Only take over-the-counter or prescription medicines for pain, discomfort, or fever as directed by your caregiver. Do not give aspirin to children. Antidiarrheal medicines are not recommended.  Ask your caregiver if you should continue to take your regular prescribed and over-the-counter medicines.  Keep all follow-up appointments as directed by your caregiver. SEEK IMMEDIATE MEDICAL CARE IF:   You are unable to keep fluids down.  You do not urinate at least once every 6 to 8 hours.  You develop shortness of breath.  You notice blood in your stool or vomit. This may look like coffee grounds.  You have abdominal pain that increases or is concentrated in one small area (localized).  You have persistent vomiting or diarrhea.  You have a fever.  The patient is a child younger than 3 months, and he or she has a fever.  The patient is a child older than 3 months, and he or she has a fever and persistent symptoms.  The patient is a child older than 3 months, and he or she has a fever and symptoms suddenly get worse.  The patient is a baby, and he or she has no tears when crying. MAKE SURE YOU:   Understand these instructions.  Will watch your condition.  Will get help right away if you are not doing well or get worse.   This information  is not intended to replace advice given to you by your health care provider. Make sure you discuss any questions you have with your health care provider.   Document Released: 11/19/2005 Document Revised: 02/11/2012 Document Reviewed: 09/05/2011 Elsevier Interactive Patient Education [employer].

## 2016-02-13 NOTE — Progress Notes (Signed)
   HPI  Patient presents today hair with vomiting, diaough.  Patient  3 days he h cough, chills, body aches, and one day symptoms of emesis and diarrhea. He's tolerating foods and fluids easily, he is denying any shortness of breath or chest pain. He states that overall he is improving I was unable to go to work today.   HTN, HLD He has mild fatigue after taking his blood pressure medicine, this lasts about 30 minutes. No dizziness, chest pain, palpitations, leg edema   PMH: Smoking status noted ROS: Per HPI  Objective: BP 133/87 mmHg  Pulse 84  Temp(Src) 97.1 F (36.2 C) (Oral)  Ht 5' 5" (1.651 m)  Wt 145 lb 9.6 oz (66.044 kg)  BMI 24.23 kg/m2 Gen: NAD, alert, cooperative with exam HEENT: NCAT moist mucosa, TMs normal bilaterally, nares clear CV: RRR, good S1/S2, no murmur Resp: CTABL, no wheezes, non-labored Ext: No edema, warm Neuro: Alert and oriented, No gross deficits  Assessment and plan:  # iral gastroenteritis Resolving Note written for work Supportive care discussed  # hyperlipidemia Tolerating atorvastatin well Repeat fasting labs  # hypertension controlled Tolerating medication well, recommended takin although I believe that this will resolve after a few weeks.     Orders Placed This Encounter  Procedures  . Veritor Flu A/B Waived    Order Specific Question:  Source    Answer:  nose  . CMP14+EGFR    Standing Status: Future     Number of Occurrences:      Standing Expiration Date: 02/12/2017  . Lipid Panel    Standing Status: Future     Number of Occurrences:      Standing Expiration Date: 02/12/2017    No orders of the defined types were placed in this encounter.    Sam Bradshaw, MD [facility] 02/13/2016, 5:03 PM      

## 2016-02-14 ENCOUNTER — Telehealth: Payer: Self-pay | Admitting: Family Medicine

## 2016-02-14 NOTE — Telephone Encounter (Signed)
I am ok with note, will ask nursing to arrange.   Laroy Apple, MD [facility] 02/14/2016, 12:01 PM

## 2016-02-14 NOTE — Telephone Encounter (Signed)
Patient aware and note placed up front.

## 2016-02-25 ENCOUNTER — Other Ambulatory Visit: Payer: Managed Care, Other (non HMO)

## 2016-02-25 DIAGNOSIS — I10 Essential (primary) hypertension: Secondary | ICD-10-CM

## 2016-02-25 DIAGNOSIS — E785 Hyperlipidemia, unspecified: Secondary | ICD-10-CM

## 2016-02-26 LAB — LIPID PANEL
CHOLESTEROL TOTAL: 148 mg/dL (ref 100–199)
Chol/HDL Ratio: 3.7 ratio units (ref 0.0–5.0)
HDL: 40 mg/dL (ref >39)
LDL CALC: 75 mg/dL (ref 0–99)
TRIGLYCERIDES: 167 mg/dL — AB (ref 0–149)
VLDL CHOLESTEROL CAL: 33 mg/dL (ref 5–40)

## 2016-02-26 LAB — CMP14+EGFR
ALBUMIN: 4.4 g/dL (ref 3.5–5.5)
ALK PHOS: 97 IU/L (ref 39–117)
ALT: 35 IU/L (ref 0–44)
AST: 27 IU/L (ref 0–40)
Albumin/Globulin Ratio: 2 (ref 1.2–2.2)
BILIRUBIN TOTAL: 0.4 mg/dL (ref 0.0–1.2)
BUN / CREAT RATIO: 12 (ref 9–20)
BUN: 10 mg/dL (ref 6–24)
CHLORIDE: 101 mmol/L (ref 96–106)
CO2: 21 mmol/L (ref 18–29)
CREATININE: 0.86 mg/dL (ref 0.76–1.27)
Calcium: 9.1 mg/dL (ref 8.7–10.2)
GFR calc Af Amer: 116 mL/min/{1.73_m2} (ref >59)
GFR calc non Af Amer: 100 mL/min/{1.73_m2} (ref >59)
GLUCOSE: 90 mg/dL (ref 65–99)
Globulin, Total: 2.2 g/dL (ref 1.5–4.5)
Potassium: 4.6 mmol/L (ref 3.5–5.2)
Sodium: 142 mmol/L (ref 134–144)
Total Protein: 6.6 g/dL (ref 6.0–8.5)

## 2016-03-01 ENCOUNTER — Telehealth (INDEPENDENT_AMBULATORY_CARE_PROVIDER_SITE_OTHER): Payer: Self-pay | Admitting: *Deleted

## 2016-03-01 NOTE — Telephone Encounter (Signed)
agree

## 2016-03-01 NOTE — Telephone Encounter (Signed)
Referring MD/PCP: bradshaw   Procedure: tcs  Reason/Indication:  Screening, fam hx colon ca  Has patient had this procedure before?  no  If so, when, by whom and where?    Is there a family history of colon cancer?  Yes, father  Who?  What age when diagnosed?    Is patient diabetic?   no      Does patient have prosthetic heart valve or mechanical valve?  no  Do you have a pacemaker?  no  Has patient ever had endocarditis? no  Has patient had joint replacement within last 12 months?  no  Does patient tend to be constipated or take laxatives? no  Does patient have a history of alcohol/drug use?  no  Is patient on Coumadin, Plavix and/or Aspirin? no  Medications: see epic  Allergies: nkda  Medication Adjustment:   Procedure date & time: 03/28/16 at 1030

## 2016-03-28 ENCOUNTER — Encounter (HOSPITAL_COMMUNITY): Payer: Self-pay | Admitting: *Deleted

## 2016-03-28 ENCOUNTER — Encounter (HOSPITAL_COMMUNITY)
Admission: RE | Disposition: A | Discharge status: Home / Self Care | Payer: Self-pay | Source: Ambulatory Visit | Attending: Internal Medicine

## 2016-03-28 ENCOUNTER — Ambulatory Visit (HOSPITAL_COMMUNITY)
Admission: RE | Admit: 2016-03-28 | Discharge: 2016-03-28 | Disposition: A | Discharge status: Home / Self Care | Payer: Managed Care, Other (non HMO) | Source: Ambulatory Visit | Attending: Internal Medicine | Admitting: Internal Medicine

## 2016-03-28 DIAGNOSIS — Z79899 Other long term (current) drug therapy: Secondary | ICD-10-CM | POA: Insufficient documentation

## 2016-03-28 DIAGNOSIS — F17200 Nicotine dependence, unspecified, uncomplicated: Secondary | ICD-10-CM | POA: Insufficient documentation

## 2016-03-28 DIAGNOSIS — K5730 Diverticulosis of large intestine without perforation or abscess without bleeding: Secondary | ICD-10-CM | POA: Diagnosis not present

## 2016-03-28 DIAGNOSIS — I10 Essential (primary) hypertension: Secondary | ICD-10-CM | POA: Diagnosis not present

## 2016-03-28 DIAGNOSIS — D123 Benign neoplasm of transverse colon: Secondary | ICD-10-CM | POA: Insufficient documentation

## 2016-03-28 DIAGNOSIS — Z800 Family history of malignant neoplasm of digestive organs: Secondary | ICD-10-CM

## 2016-03-28 DIAGNOSIS — E7800 Pure hypercholesterolemia, unspecified: Secondary | ICD-10-CM | POA: Diagnosis not present

## 2016-03-28 DIAGNOSIS — D125 Benign neoplasm of sigmoid colon: Secondary | ICD-10-CM | POA: Insufficient documentation

## 2016-03-28 DIAGNOSIS — K644 Residual hemorrhoidal skin tags: Secondary | ICD-10-CM | POA: Insufficient documentation

## 2016-03-28 DIAGNOSIS — Z1211 Encounter for screening for malignant neoplasm of colon: Secondary | ICD-10-CM

## 2016-03-28 HISTORY — PX: COLONOSCOPY: SHX5424

## 2016-03-28 SURGERY — COLONOSCOPY
Anesthesia: Moderate Sedation

## 2016-03-28 MED ORDER — SIMETHICONE 40 MG/0.6ML PO SUSP
ORAL | Status: DC | PRN
  Administered 2016-03-28: 10:00:00

## 2016-03-28 MED ORDER — SODIUM CHLORIDE 0.9 % IV SOLN
INTRAVENOUS | Status: DC
  Administered 2016-03-28: 1000 mL via INTRAVENOUS

## 2016-03-28 MED ORDER — MIDAZOLAM HCL 5 MG/5ML IJ SOLN
INTRAMUSCULAR | Status: DC | PRN
  Administered 2016-03-28: 2 mg via INTRAVENOUS
  Administered 2016-03-28 (×3): 1 mg via INTRAVENOUS
  Administered 2016-03-28: 2 mg via INTRAVENOUS

## 2016-03-28 MED ORDER — MEPERIDINE HCL 50 MG/ML IJ SOLN
INTRAMUSCULAR | Status: AC
  Filled 2016-03-28: qty 1

## 2016-03-28 MED ORDER — MEPERIDINE HCL 50 MG/ML IJ SOLN
INTRAMUSCULAR | Status: DC | PRN
  Administered 2016-03-28 (×2): 25 mg via INTRAVENOUS

## 2016-03-28 MED ORDER — MIDAZOLAM HCL 5 MG/5ML IJ SOLN
INTRAMUSCULAR | Status: DC
Start: 2016-03-28 — End: 2016-03-28
  Filled 2016-03-28: qty 10

## 2016-03-28 NOTE — H&P (Addendum)
George Estes is an 50 y.o. male.   Chief Complaint: Patient is here for colonoscopy. HPI: She is 50 year old Caucasian male who is here for screening colonoscopy. He states last exam was probably 20 years ago for rectal bleeding. He denies abdominal pain change in bowel habits or rectal bleeding. Family history significant for CRC in his father was diagnosed in her early 76s, paternal uncle who was in his late 6s and more recently paternal aunt was diagnosed with colon carcinoma at 15.  Past Medical History  Diagnosis Date  . Hepatitis C antibody test positiveFelt to be false positive was HCV RNA was negative.    . Hypertension   . High cholesterol     History reviewed. No pertinent past surgical history.  Family History  Problem Relation Age of Onset  . Cancer Father     colon  . Cancer Maternal Uncle   . Cancer Maternal Grandfather    Social History:  reports that he has been smoking.  He does not have any smokeless tobacco history on file. He reports that he does not drink alcohol or use illicit drugs.  Allergies: No Known Allergies  Medications Prior to Admission  Medication Sig Dispense Refill  . amLODipine (NORVASC) 5 MG tablet Take 1 tablet (5 mg total) by mouth daily. 90 tablet 3  . atorvastatin (LIPITOR) 20 MG tablet Take 1 tablet (20 mg total) by mouth daily. 90 tablet 3    No results found for this or any previous visit (from the past 48 hour(s)). No results found.  ROS  Blood pressure 134/86, pulse 63, temperature 97.4 F (36.3 C), temperature source Oral, resp. rate 18, height 5\' 5"  (1.651 m), weight 145 lb (65.772 kg), SpO2 98 %. Physical Exam  Constitutional: He appears well-developed and well-nourished.  HENT:  Mouth/Throat: Oropharynx is clear and moist.  Eyes: Conjunctivae are normal. No scleral icterus.  Neck: No thyromegaly present.  Cardiovascular: Normal rate, regular rhythm and normal heart sounds.   No murmur heard. Respiratory: Effort normal  and breath sounds normal.  GI: Soft. He exhibits no distension and no mass. There is no tenderness.  Musculoskeletal: He exhibits no edema.  Lymphadenopathy:    He has no cervical adenopathy.  Neurological: He is alert.  Skin: Skin is warm and dry.     Assessment/Plan High risk screening colonoscopy.  Rogene Houston, MD 03/28/2016, 10:08 AM

## 2016-03-28 NOTE — Op Note (Signed)
[facility] Patient Name: George Estes Procedure Date: 03/28/2016 10:05 AM MRN: [id number] Date of Birth: July 18, 1966 Attending MD: Hildred Laser , MD CSN: [id number] Age: 50 Admit Type: Outpatient Procedure:                Colonoscopy Indications:              Screening patient at increased risk: Family history                            of 1st-degree relative with colorectal cancer at                            age 69 years (or older), Screening patient at                            increased risk: Family history of colorectal cancer                            in multiple 1st-degree relatives Providers:                Hildred Laser, MD, Gwenlyn Fudge, RN, Georgeann Oppenheim,                            Technician Referring MD:             Sherley Bounds. Wendi Snipes. MD Medicines:                Meperidine 50 mg IV, Midazolam 7 mg IV Complications:            No immediate complications. Estimated Blood Loss:     Estimated blood loss was minimal. Estimated blood                            loss was minimal. Procedure:                Pre-Anesthesia Assessment:                           - Prior to the procedure, a History and Physical                            was performed, and patient medications and                            allergies were reviewed. The patient's tolerance of                            previous anesthesia was also reviewed. The risks                            and benefits of the procedure and the sedation                            options and risks were discussed with the patient.  All questions were answered, and informed consent                            was obtained. Prior Anticoagulants: The patient has                            taken no previous anticoagulant or antiplatelet                            agents. ASA Grade Assessment: I - A normal, healthy                            patient. After reviewing the risks and benefits,               the patient was deemed in satisfactory condition to                            undergo the procedure.                           After obtaining informed consent, the colonoscope                            was passed under direct vision. Throughout the                            procedure, the patient's blood pressure, pulse, and                            oxygen saturations were monitored continuously. The                            [id number]) scope was introduced through                            the anus and advanced to the the cecum, identified                            by appendiceal orifice and ileocecal valve. The                            quality of the bowel preparation was adequate. The                            ileocecal valve, appendiceal orifice, and rectum                            were photographed. Scope In: 10:20:10 AM Scope Out: 11:00:34 AM Scope Withdrawal Time: 0 hours 31 minutes 38 seconds  Total Procedure Duration: 0 hours 40 minutes 24 seconds  Findings:      Five sessile polyps were found in the hepatic flexure. The polyps were 5       to 7 mm in size. These polyps were removed with a hot snare. Resection  and retrieval were complete.      Two sessile polyps were found in the transverse colon. The polyps were 5       to 6 mm in size. These polyps were removed with a cold snare. Resection       and retrieval were complete.      Two semi-pedunculated polyps were found in the sigmoid colon. The polyps       were 8 mm in size. These polyps were removed with a hot snare. Resection       and retrieval were complete.      few small diverticula at sigmoid colon.      External hemorrhoids were found during retroflexion. The hemorrhoids       were medium-sized.      A few small-mouthed diverticula were found in the sigmoid colon. Impression:               - Five 5 to 7 mm polyps at the hepatic flexure,                            removed with  a hot snare. Resected and retrieved.                           - Two 5 to 6 mm polyps in the transverse colon,                            removed with a cold snare. Resected and retrieved.                           - Two 8 mm polyps in the sigmoid colon, removed                            with a hot snare. Resected and retrieved.                           - Few small diverticula at sigmoid colon.                           - External hemorrhoids.                           note: patient had 9 polyps altogether ranging in                            size from 5-8 mm. Polyps from hepatic flexure were                            submitted together. Polyps from transverse colon                            were submitted together. Polyps from sigmoid colon                            were submitted together. Moderate Sedation:      Moderate (conscious) sedation was administered by the endoscopy nurse  and supervised by the endoscopist. The following parameters were       monitored: oxygen saturation, heart rate, blood pressure, CO2       capnography and response to care. Total physician intraservice time was       46 minutes. Recommendation:           - Patient has a contact number available for                            emergencies. The signs and symptoms of potential                            delayed complications were discussed with the                            patient. Return to normal activities tomorrow.                            Written discharge instructions were provided to the                            patient.                           - Resume previous diet today.                           - Continue present medications.                           - No aspirin, ibuprofen, naproxen, or other                            non-steroidal anti-inflammatory drugs for 7 days                            after polyp removal.                           - Repeat colonoscopy in 3 years for  surveillance.                           - Await pathology results. Procedure Code(s):        --- Professional ---                           [phone], Colonoscopy, flexible; with removal of                            tumor(s), polyp(s), or other lesion(s) by snare                            technique                           99152, Moderate sedation services provided by the  same physician or other qualified health care                            professional performing the diagnostic or                            therapeutic service that the sedation supports,                            requiring the presence of an independent trained                            observer to assist in the monitoring of the                            patient's level of consciousness and physiological                            status; initial 15 minutes of intraservice time,                            patient age 73 years or older                           [id number], Moderate sedation services; each additional                            15 minutes intraservice time                           99153, Moderate sedation services; each additional                            15 minutes intraservice time Diagnosis Code(s):        --- Professional ---                           D12.3, Benign neoplasm of transverse colon (hepatic                            flexure or splenic flexure)                           D12.5, Benign neoplasm of sigmoid colon                           K64.4, Residual hemorrhoidal skin tags                           Z80.0, Family history of malignant neoplasm of                            digestive organs CPT copyright 2016 American Medical Association. All rights reserved. The codes documented in this report are preliminary and upon coder review may  be revised to meet current compliance requirements. [employer],  MD Hildred Laser, MD 03/28/2016 11:13:44 AM This report has  been signed electronically. Number of Addenda: 0

## 2016-03-28 NOTE — Discharge Instructions (Signed)
No aspirin or NSAIDs for 1 week. Resume usual medications and diet. No driving for 24 hours. Physician will call with biopsy results.  Colonoscopy, Care After These instructions give you information on caring for yourself after your procedure. Your doctor may also give you more specific instructions. Call your doctor if you have any problems or questions after your procedure. HOME CARE  Do not drive for 24 hours.  Do not sign important papers or use machinery for 24 hours.  You may shower.  You may go back to your usual activities, but go slower for the first 24 hours.  Take rest breaks often during the first 24 hours.  Walk around or use warm packs on your belly (abdomen) if you have belly cramping or gas.  Drink enough fluids to keep your pee (urine) clear or pale yellow.  Resume your normal diet. Avoid heavy or fried foods.  Avoid drinking alcohol for 24 hours or as told by your doctor.  Only take medicines as told by your doctor. If a tissue sample (biopsy) was taken during the procedure:   Do not take aspirin or blood thinners for 7 days, or as told by your doctor.  Do not drink alcohol for 7 days, or as told by your doctor.  Eat soft foods for the first 24 hours. GET HELP IF: You still have a small amount of blood in your poop (stool) 2-3 days after the procedure. GET HELP RIGHT AWAY IF:  You have more than a small amount of blood in your poop.  You see clumps of tissue (blood clots) in your poop.  Your belly is puffy (swollen).  You feel sick to your stomach (nauseous) or throw up (vomit).  You have a fever.  You have belly pain that gets worse and medicine does not help. MAKE SURE YOU:  Understand these instructions.  Will watch your condition.  Will get help right away if you are not doing well or get worse.   This information is not intended to replace advice given to you by your health care provider. Make sure you discuss any questions you have  with your health care provider.   Document Released: 12/22/2010 Document Revised: 11/24/2013 Document Reviewed: 07/27/2013 Elsevier Interactive Patient Education 2016 Else[employer]   Colon Polyps Polyps are lumps of extra tissue growing inside the body. Polyps can grow in the large intestine (colon). Most colon polyps are noncancerous (benign). However, some colon polyps can become cancerous over time. Polyps that are larger than a pea may be harmful. To be safe, caregivers remove and test all polyps. CAUSES  Polyps form when mutations in the genes cause your cells to grow and divide even though no more tissue is needed. RISK FACTORS There are a number of risk factors that can increase your chances of getting colon polyps. They include:  Being older than 50 years.  Family history of colon polyps or colon cancer.  Long-term colon diseases, such as colitis or Crohn disease.  Being overweight.  Smoking.  Being inactive.  Drinking too much alcohol. SYMPTOMS  Most small polyps do not cause symptoms. If symptoms are present, they may include:  Blood in the stool. The stool may look dark red or black.  Constipation or diarrhea that lasts longer than 1 week. DIAGNOSIS People often do not know they have polyps until their caregiver finds them during a regular checkup. Your caregiver can use 4 tests to check for polyps:  Digital rectal exam. The caregiver wears  gloves and feels inside the rectum. This test would find polyps only in the rectum.  Barium enema. The caregiver puts a liquid called barium into your rectum before taking X-rays of your colon. Barium makes your colon look white. Polyps are dark, so they are easy to see in the X-ray pictures.  Sigmoidoscopy. A thin, flexible tube (sigmoidoscope) is placed into your rectum. The sigmoidoscope has a light and tiny camera in it. The caregiver uses the sigmoidoscope to look at the last third of your colon.  Colonoscopy. This test is  like sigmoidoscopy, but the caregiver looks at the entire colon. This is the most common method for finding and removing polyps. TREATMENT  Any polyps will be removed during a sigmoidoscopy or colonoscopy. The polyps are then tested for cancer. PREVENTION  To help lower your risk of getting more colon polyps:  Eat plenty of fruits and vegetables. Avoid eating fatty foods.  Do not smoke.  Avoid drinking alcohol.  Exercise every day.  Lose weight if recommended by your caregiver.  Eat plenty of calcium and folate. Foods that are rich in calcium include milk, cheese, and broccoli. Foods that are rich in folate include chickpeas, kidney beans, and spinach. HOME CARE INSTRUCTIONS Keep all follow-up appointments as directed by your caregiver. You may need periodic exams to check for polyps. SEEK MEDICAL CARE IF: You notice bleeding during a bowel movement.   This information is not intended to replace advice given to you by your health care provider. Make sure you discuss any questions you have with your health care provider.   Document Released: 08/15/2004 Document Revised: 12/10/2014 Document Reviewed: 01/29/2012 Elsevier Interactive Patient Education 2016 [employer].   Diverticulosis Diverticulosis is the condition that develops when small pouches (diverticula) form in the wall of your colon. Your colon, or large intestine, is where water is absorbed and stool is formed. The pouches form when the inside layer of your colon pushes through weak spots in the outer layers of your colon. CAUSES  No one knows exactly what causes diverticulosis. RISK FACTORS  Being older than 26. Your risk for this condition increases with age. Diverticulosis is rare in people younger than 40 years. By age 60, almost everyone has it.  Eating a low-fiber diet.  Being frequently constipated.  Being overweight.  Not getting enough exercise.  Smoking.  Taking over-the-counter pain medicines, like  aspirin and ibuprofen. SYMPTOMS  Most people with diverticulosis do not have symptoms. DIAGNOSIS  Because diverticulosis often has no symptoms, health care providers often discover the condition during an exam for other colon problems. In many cases, a health care provider will diagnose diverticulosis while using a flexible scope to examine the colon (colonoscopy). TREATMENT  If you have never developed an infection related to diverticulosis, you may not need treatment. If you have had an infection before, treatment may include:  Eating more fruits, vegetables, and grains.  Taking a fiber supplement.  Taking a live bacteria supplement (probiotic).  Taking medicine to relax your colon. HOME CARE INSTRUCTIONS   Drink at least 6-8 glasses of water each day to prevent constipation.  Try not to strain when you have a bowel movement.  Keep all follow-up appointments. If you have had an infection before:  Increase the fiber in your diet as directed by your health care provider or dietitian.  Take a dietary fiber supplement if your health care provider approves.  Only take medicines as directed by your health care provider. Hope  IF:   You have abdominal pain.  You have bloating.  You have cramps.  You have not gone to the bathroom in 3 days. SEEK IMMEDIATE MEDICAL CARE IF:   Your pain gets worse.  Yourbloating becomes very bad.  You have a fever or chills, and your symptoms suddenly get worse.  You begin vomiting.  You have bowel movements that are bloody or black. MAKE SURE YOU:  Understand these instructions.  Will watch your condition.  Will get help right away if you are not doing well or get worse.   This information is not intended to replace advice given to you by your health care provider. Make sure you discuss any questions you have with your health care provider.   Document Released: 08/16/2004 Document Revised: 11/24/2013 Document Reviewed:  10/14/2013 Elsevier Interactive Patient Education [employer].

## 2016-03-30 ENCOUNTER — Encounter (HOSPITAL_COMMUNITY): Payer: Self-pay | Admitting: Internal Medicine

## 2016-05-01 ENCOUNTER — Encounter: Payer: Self-pay | Admitting: Nurse Practitioner

## 2016-05-01 ENCOUNTER — Ambulatory Visit (INDEPENDENT_AMBULATORY_CARE_PROVIDER_SITE_OTHER): Payer: Managed Care, Other (non HMO) | Admitting: Nurse Practitioner

## 2016-05-01 ENCOUNTER — Encounter (INDEPENDENT_AMBULATORY_CARE_PROVIDER_SITE_OTHER): Payer: Self-pay

## 2016-05-01 VITALS — BP 135/92 | HR 85 | Ht 65.0 in | Wt 145.8 lb

## 2016-05-01 DIAGNOSIS — J020 Streptococcal pharyngitis: Secondary | ICD-10-CM

## 2016-05-01 DIAGNOSIS — J029 Acute pharyngitis, unspecified: Secondary | ICD-10-CM | POA: Diagnosis not present

## 2016-05-01 LAB — RAPID STREP SCREEN (MED CTR MEBANE ONLY): STREP GP A AG, IA W/REFLEX: POSITIVE — AB

## 2016-05-01 MED ORDER — AMOXICILLIN 875 MG PO TABS
875.0000 mg | ORAL_TABLET | Freq: Two times a day (BID) | ORAL | Status: DC
Start: 2016-05-01 — End: 2016-09-05

## 2016-05-01 NOTE — Patient Instructions (Signed)

## 2016-05-01 NOTE — Progress Notes (Signed)
   Subjective:    Patient ID: George Estes, male    DOB: 1965-12-24, 50 y.o.   MRN: [id number]  HPI Patient in c/o sore throat- started Saturday morning and has gradually gotten worse- seems  To hurt mor in the mornings then during the day. No exposure to strep throat that he is aware of. Denies any fever- has felt light headed and thought maybe his blood pressure was elevated.    Review of Systems  Constitutional: Positive for appetite change (decreased for 2 days).  HENT: Positive for congestion, sore throat and trouble swallowing. Negative for voice change.   Respiratory: Positive for cough. Negative for shortness of breath.   Cardiovascular: Negative.   Gastrointestinal: Negative.   Genitourinary: Negative.   Musculoskeletal: Negative.   Neurological: Negative.   Psychiatric/Behavioral: Negative.   All other systems reviewed and are negative.      Objective:   Physical Exam  Constitutional: He appears well-developed and well-nourished. No distress.  HENT:  Right Ear: Hearing, tympanic membrane, external ear and ear canal normal.  Left Ear: Hearing, tympanic membrane, external ear and ear canal normal.  Nose: Mucosal edema and rhinorrhea present. Right sinus exhibits no maxillary sinus tenderness and no frontal sinus tenderness. Left sinus exhibits no maxillary sinus tenderness and no frontal sinus tenderness.  Mouth/Throat: Posterior oropharyngeal edema and posterior oropharyngeal erythema present.  Neck: Normal range of motion. Neck supple.  Cardiovascular: Normal rate, regular rhythm and normal heart sounds.   Pulmonary/Chest: Effort normal and breath sounds normal.  Neurological: He is alert.  Skin: Skin is warm.  Psychiatric: He has a normal mood and affect. His behavior is normal. Judgment and thought content normal.   BP 135/92 mmHg  Pulse 85  Ht 5\' 5"  (1.651 m)  Wt 145 lb 12.8 oz (66.134 kg)  BMI 24.26 kg/m2       Assessment & Plan:   1. Sore throat   2.  Strep pharyngitis    Meds ordered this encounter  Medications  . amoxicillin (AMOXIL) 875 MG tablet    Sig: Take 1 tablet (875 mg total) by mouth 2 (two) times daily. 1 po BID    Dispense:  20 tablet    Refill:  0    Order Specific Question:  Supervising Provider    Answer:  Joycelyn Man   Force fluids Motrin or tylenol OTC New tooth brush in 3 days RTO prn  Mary-Margaret Hassell Done, FNP

## 2016-05-03 ENCOUNTER — Telehealth: Payer: Self-pay | Admitting: Family

## 2016-05-03 NOTE — Telephone Encounter (Signed)
Pt is still running fever and needed work note extended Work note to front for pt pick up

## 2016-09-05 ENCOUNTER — Ambulatory Visit (INDEPENDENT_AMBULATORY_CARE_PROVIDER_SITE_OTHER): Payer: Managed Care, Other (non HMO) | Admitting: Family Medicine

## 2016-09-05 ENCOUNTER — Encounter: Payer: Self-pay | Admitting: Family Medicine

## 2016-09-05 VITALS — BP 145/99 | HR 66 | Temp 97.0°F | Ht 65.0 in | Wt 143.2 lb

## 2016-09-05 DIAGNOSIS — J0100 Acute maxillary sinusitis, unspecified: Secondary | ICD-10-CM | POA: Diagnosis not present

## 2016-09-05 MED ORDER — AMOXICILLIN 875 MG PO TABS: 875.0000 mg | ORAL_TABLET | Freq: Two times a day (BID) | ORAL | 0 refills | Status: DC

## 2016-09-05 NOTE — Progress Notes (Signed)
   Subjective:    Patient ID: George Estes, male    DOB: [birth year], 50 y.o.   MRN: [id number]  HPI 50 year old gentleman who has some flulike symptoms which include runny nose congested head myalgias and cough productive of green sputum. He has not had any fever. Symptoms started about 2 days ago.  Patient Active Problem List   Diagnosis Date Noted  . Abdominal pain, epigastric 08/11/2015  . HLD (hyperlipidemia) 08/11/2015  . HTN (hypertension) 07/07/2015  . Tobacco abuse 07/07/2015  . Healthcare maintenance 07/07/2015  . Annual physical exam 07/07/2015   Outpatient Encounter Prescriptions as of 09/05/2016  Medication Sig  . [DISCONTINUED] amoxicillin (AMOXIL) 875 MG tablet Take 1 tablet (875 mg total) by mouth 2 (two) times daily. 1 po BID   No facility-administered encounter medications on file as of 09/05/2016.       Review of Systems  Constitutional: Negative.   HENT: Positive for congestion and sinus pressure.   Respiratory: Positive for cough.   Cardiovascular: Negative.   Genitourinary: Negative.        Objective:   Physical Exam  Constitutional: He is oriented to person, place, and time. He appears well-developed and well-nourished.  HENT:  Sinuses are tender to percussion and with forward bending  Cardiovascular: Normal rate and regular rhythm.   Pulmonary/Chest: Effort normal and breath sounds normal. He has no wheezes.  Neurological: He is alert and oriented to person, place, and time.   BP (!) 145/99   Pulse 66   Temp 97 F (36.1 C) (Oral)   Ht 5\' 5"  (1.651 m)   Wt 143 lb 3.2 oz (65 kg)   BMI 23.83 kg/m         Assessment & Plan:  1. Acute maxillary sinusitis, recurrence not specified It is hard to make a case for flu without fever and with a productive cough. I think his symptoms are more consistent with a sinusitis. We'll treat with amoxicillin 875. Also recommended plain Mucinex, plenty of fluids, and hot showers.  Wardell Honour MD

## 2016-12-06 ENCOUNTER — Encounter (HOSPITAL_COMMUNITY): Payer: Self-pay | Admitting: Emergency Medicine

## 2016-12-06 ENCOUNTER — Emergency Department (HOSPITAL_COMMUNITY)
Admission: EM | Admit: 2016-12-06 | Discharge: 2016-12-06 | Disposition: A | Discharge status: Home / Self Care | Payer: Managed Care, Other (non HMO) | Attending: Emergency Medicine | Admitting: Emergency Medicine

## 2016-12-06 ENCOUNTER — Emergency Department (HOSPITAL_COMMUNITY): Payer: Managed Care, Other (non HMO)

## 2016-12-06 DIAGNOSIS — R509 Fever, unspecified: Secondary | ICD-10-CM | POA: Insufficient documentation

## 2016-12-06 DIAGNOSIS — R51 Headache: Secondary | ICD-10-CM | POA: Insufficient documentation

## 2016-12-06 DIAGNOSIS — R197 Diarrhea, unspecified: Secondary | ICD-10-CM | POA: Diagnosis not present

## 2016-12-06 DIAGNOSIS — I10 Essential (primary) hypertension: Secondary | ICD-10-CM | POA: Diagnosis not present

## 2016-12-06 DIAGNOSIS — R69 Illness, unspecified: Secondary | ICD-10-CM

## 2016-12-06 DIAGNOSIS — J111 Influenza due to unidentified influenza virus with other respiratory manifestations: Secondary | ICD-10-CM

## 2016-12-06 DIAGNOSIS — R05 Cough: Secondary | ICD-10-CM | POA: Diagnosis not present

## 2016-12-06 DIAGNOSIS — F17200 Nicotine dependence, unspecified, uncomplicated: Secondary | ICD-10-CM | POA: Diagnosis not present

## 2016-12-06 MED ORDER — OSELTAMIVIR PHOSPHATE 75 MG PO CAPS: 75.0000 mg | ORAL_CAPSULE | Freq: Two times a day (BID) | ORAL | 0 refills | Status: DC

## 2016-12-06 NOTE — ED Triage Notes (Signed)
Multiple symptoms, cough, headache, sore throat, body aches

## 2016-12-06 NOTE — ED Triage Notes (Signed)
Pt also having diarrhea and slight abdomen discomfort

## 2016-12-06 NOTE — ED Provider Notes (Signed)
[facility] DEPT Provider Note   CSN: [id number] Arrival date & time: 12/06/16  1152     History   Chief Complaint Chief Complaint  Patient presents with  . Generalized Body Aches    HPI BAILEE MACKIN is a 51 y.o. male.  HPI  51 y.o. Male complaining of cough, subjective fever, chills,  Joint pain, body aches, diarrhea began last night.  Patient taking po liquids with loose stools but no vomiting diarrhea.  No known exposures to sick people.  Patient did not have flu shot. He is a smoker and continues to smoke.  Some headache, cough producing green sputum, no dyspnea, abdominal pain.    Past Medical History:  Diagnosis Date  . Hepatitis C antibody test positive   . High cholesterol   . Hypertension     Patient Active Problem List   Diagnosis Date Noted  . Abdominal pain, epigastric 08/11/2015  . HLD (hyperlipidemia) 08/11/2015  . HTN (hypertension) 07/07/2015  . Tobacco abuse 07/07/2015  . Healthcare maintenance 07/07/2015  . Annual physical exam 07/07/2015    Past Surgical History:  Procedure Laterality Date  . COLONOSCOPY N/A 03/28/2016   Procedure: COLONOSCOPY;  Surgeon: Rogene Houston, MD;  Location: [facility];  Service: Endoscopy;  Laterality: N/A;  1030       Home Medications    Prior to Admission medications   Medication Sig Start Date End Date Taking? Authorizing Provider  amoxicillin (AMOXIL) 875 MG tablet Take 1 tablet (875 mg total) by mouth 2 (two) times daily. 09/05/16   Wardell Honour, MD    Family History Family History  Problem Relation Age of Onset  . Cancer Father     colon  . Cancer Maternal Uncle   . Cancer Maternal Grandfather     Social History Social History  Substance Use Topics  . Smoking status: Current Every Day Smoker    Packs/day: 1.00    Years: 30.00  . Smokeless tobacco: Never Used  . Alcohol use No     Allergies   Patient has no known allergies.   Review of Systems Review of Systems  All other  systems reviewed and are negative.    Physical Exam Updated Vital Signs BP 127/81 (BP Location: Left Arm)   Pulse 82   Temp 97.5 F (36.4 C) (Oral)   Resp 20   Ht 5\' 5"  (1.651 m)   Wt 65.8 kg   SpO2 96%   BMI 24.13 kg/m   Physical Exam  Constitutional: He is oriented to person, place, and time. He appears well-developed and well-nourished.  HENT:  Head: Normocephalic and atraumatic.  Right Ear: External ear normal.  Left Ear: External ear normal.  Nose: Nose normal.  Mouth/Throat: Oropharynx is clear and moist.  Eyes: Conjunctivae and EOM are normal. Pupils are equal, round, and reactive to light.  Neck: Normal range of motion. Neck supple.  Cardiovascular: Normal rate, regular rhythm, normal heart sounds and intact distal pulses.   Pulmonary/Chest: Effort normal and breath sounds normal. No respiratory distress. He has no wheezes. He exhibits no tenderness.  Abdominal: Soft. Bowel sounds are normal. He exhibits no distension and no mass. There is no tenderness. There is no guarding.  Musculoskeletal: Normal range of motion.  Neurological: He is alert and oriented to person, place, and time. He has normal reflexes. He exhibits normal muscle tone. Coordination normal.  Skin: Skin is warm and dry.  Psychiatric: He has a normal mood and affect. His behavior  is normal. Judgment and thought content normal.  Nursing note and vitals reviewed.    ED Treatments / Results  Labs (all labs ordered are listed, but only abnormal results are displayed) Labs Reviewed - No data to display  EKG  EKG Interpretation None       Radiology Dg Chest 2 View  Result Date: 12/06/2016 CLINICAL DATA:  Cough and fevers EXAM: CHEST  2 VIEW COMPARISON:  02/18/2004 FINDINGS: The heart size and mediastinal contours are within normal limits. Both lungs are clear. The visualized skeletal structures are unremarkable. IMPRESSION: No active cardiopulmonary disease. Electronically Signed   By: Inez Catalina M.D.   On: 12/06/2016 15:52    Procedures Procedures (including critical care time)  Medications Ordered in ED Medications - No data to display   Initial Impression / Assessment and Plan / ED Course  I have reviewed the triage vital signs and the nursing notes.  Pertinent labs & imaging results that were available during my care of the patient were reviewed by me and considered in my medical decision making (see chart for details).  Clinical Course     52 year old man presents today with body aches, fever, chills and cough. Patient is a smoker and had chest x-Nalda Shackleford obtained. He does not have any evidence of infiltrate on his chest x-Erol Flanagin. He is a well-appearing here. He is advised regarding smoking cessation. He has no known sick contacts. Illness was consistent with viral syndrome. He did not shot. Given the symptoms have began today. We discussed starting Tamiflu patient is a smoker. Plan prescription for Tamiflu. Work note until Saturday. We have discussed return precautions need for follow-up and he voices understanding.  Final Clinical Impressions(s) / ED Diagnoses   Final diagnoses:  Influenza-like illness    New Prescriptions New Prescriptions   OSELTAMIVIR (TAMIFLU) 75 MG CAPSULE    Take 1 capsule (75 mg total) by mouth every 12 (twelve) hours.     Pattricia Boss, MD 12/06/16 1600

## 2017-07-18 ENCOUNTER — Encounter: Payer: Self-pay | Admitting: Family Medicine

## 2017-07-18 ENCOUNTER — Ambulatory Visit (INDEPENDENT_AMBULATORY_CARE_PROVIDER_SITE_OTHER): Payer: Managed Care, Other (non HMO) | Admitting: Family Medicine

## 2017-07-18 VITALS — BP 147/87 | HR 84 | Temp 97.2°F | Ht 65.0 in | Wt 153.8 lb

## 2017-07-18 DIAGNOSIS — I10 Essential (primary) hypertension: Secondary | ICD-10-CM

## 2017-07-18 MED ORDER — AMLODIPINE BESYLATE 10 MG PO TABS: 10.0000 mg | ORAL_TABLET | Freq: Every day | ORAL | 3 refills | Status: DC

## 2017-07-18 NOTE — Patient Instructions (Signed)
Great to see you!  Come back in 1 month for follow up hypertension  Start amlodipine 1 pill once daily

## 2017-07-18 NOTE — Progress Notes (Signed)
   HPI  Patient presents today here to discuss hypertension.  Patient was previously treated with amlodipine with good effect when he reverted take the medication. He had difficulty with compliance and so just quit taking the medication.  He states that he started a new job and immediately felt the blood pressure requirement. He needs also to go through some physical testing, he denies any chest pain, dyspnea, palpitations, or leg edema.  PMH: Smoking status noted ROS: Per HPI  Objective: BP (!) 147/87   Pulse 84   Temp (!) 97.2 F (36.2 C) (Oral)   Ht 5\' 5"  (1.651 m)   Wt 153 lb 12.8 oz (69.8 kg)   BMI 25.59 kg/m  Gen: NAD, alert, cooperative with exam HEENT: NCAT CV: RRR, good S1/S2, no murmur Resp: CTABL, no wheezes, non-labored Ext: No edema, warm Neuro: Alert and oriented, No gross deficits  Assessment and plan:  # Hypertension Uncontrolled without medications, restart amlodipine, 10 mg once daily. Follow-up one month Discussed the patient may try half tablet at has symptoms of hypotension. Form filled out to allow him to have physical testing for his new possible position.     Meds ordered this encounter  Medications  . amLODipine (NORVASC) 10 MG tablet    Sig: Take 1 tablet (10 mg total) by mouth daily.    Dispense:  90 tablet    Refill:  [place], MD [place] 07/18/2017, 11:10 AM

## 2017-07-24 ENCOUNTER — Telehealth: Payer: Self-pay

## 2017-07-24 NOTE — Telephone Encounter (Signed)
Patient was placed on amlodipine 10mg  8/16. Patient states he takes the medication each night at 7pm and has not missed a dose. Last night patient took his bp and it was 138/85 at 6pm when he got home from work. This morning when patient woke up at 6 am he took his bp and it was 161/101 even states he took it a few times and all the readings where around the same. Patient is now at work and will not be able to take his bp until he goes home tonight from work. Covering PCP- please advise.

## 2017-07-24 NOTE — Telephone Encounter (Signed)
Patient aware and verbalizes understanding. Does not have follow up but will make a one month follow up with Wendi Snipes.

## 2017-07-24 NOTE — Telephone Encounter (Signed)
Attempted to contact patient - NVM,

## 2017-07-24 NOTE — Telephone Encounter (Signed)
Continue on the amlodipine daily, it has just been a week, and there should be a smoothing out of the readings.  If it increases call back. Keep appointment, does he have a 1 month follow up?

## 2017-08-30 ENCOUNTER — Ambulatory Visit (INDEPENDENT_AMBULATORY_CARE_PROVIDER_SITE_OTHER): Payer: Managed Care, Other (non HMO) | Admitting: Physician Assistant

## 2017-08-30 ENCOUNTER — Encounter: Payer: Self-pay | Admitting: Physician Assistant

## 2017-08-30 VITALS — BP 131/84 | HR 83 | Temp 97.3°F | Ht 65.0 in | Wt 149.8 lb

## 2017-08-30 DIAGNOSIS — K529 Noninfective gastroenteritis and colitis, unspecified: Secondary | ICD-10-CM | POA: Diagnosis not present

## 2017-08-30 MED ORDER — PROMETHAZINE HCL 25 MG PO TABS: 25.0000 mg | ORAL_TABLET | Freq: Three times a day (TID) | ORAL | 0 refills | Status: DC | PRN

## 2017-08-30 NOTE — Progress Notes (Signed)
BP 131/84   Pulse 83   Temp (!) 97.3 F (36.3 C) (Oral)   Ht 5\' 5"  (1.651 m)   Wt 149 lb 12.8 oz (67.9 kg)   BMI 24.93 kg/m    Subjective:    Patient ID: George Estes, male    DOB: 1966-10-18, 51 y.o.   MRN: [id number]  HPI: George Estes is a 51 y.o. male presenting on 08/30/2017 for Diarrhea; Nausea; and Headache  Has been sick for 2 days. Woke up with severe vomiting and diarrhea yesterday. He had some aching and chills, did not check his temperature.  Tried to eat soup and it ran through him.  Vomiting improved but continues with diarrhea.  Abdomen aching from the vomiting.  Relevant past medical, surgical, family and social history reviewed and updated as indicated. Allergies and medications reviewed and updated.  Past Medical History:  Diagnosis Date  . Hepatitis C antibody test positive   . High cholesterol   . Hypertension     Past Surgical History:  Procedure Laterality Date  . COLONOSCOPY N/A 03/28/2016   Procedure: COLONOSCOPY;  Surgeon: Rogene Houston, MD;  Location: [facility];  Service: Endoscopy;  Laterality: N/A;  1030    Review of Systems  Constitutional: Negative.  Negative for appetite change and fatigue.  HENT: Negative.   Eyes: Negative.  Negative for pain and visual disturbance.  Respiratory: Negative.  Negative for cough, chest tightness, shortness of breath and wheezing.   Cardiovascular: Negative.  Negative for chest pain, palpitations and leg swelling.  Gastrointestinal: Positive for abdominal pain, diarrhea, nausea and vomiting.  Endocrine: Negative.   Genitourinary: Negative.   Musculoskeletal: Negative.   Skin: Negative.  Negative for color change and rash.  Neurological: Negative.  Negative for weakness, numbness and headaches.  Psychiatric/Behavioral: Negative.     Allergies as of 08/30/2017   No Known Allergies     Medication List       Accurate as of 08/30/17  4:22 PM. Always use your most recent med list.            acetaminophen 500 MG tablet Commonly known as:  TYLENOL Take 1,000 mg by mouth every 6 (six) hours as needed for moderate pain.   amLODipine 10 MG tablet Commonly known as:  NORVASC Take 1 tablet (10 mg total) by mouth daily.   promethazine 25 MG tablet Commonly known as:  PHENERGAN Take 1 tablet (25 mg total) by mouth every 8 (eight) hours as needed for nausea or vomiting.            Discharge Care Instructions        Start     Ordered   08/30/17 0000  promethazine (PHENERGAN) 25 MG tablet  Every 8 hours PRN    Question:  Supervising Provider  Answer:  Timmothy Euler   08/30/17 1617         Objective:    BP 131/84   Pulse 83   Temp (!) 97.3 F (36.3 C) (Oral)   Ht 5\' 5"  (1.651 m)   Wt 149 lb 12.8 oz (67.9 kg)   BMI 24.93 kg/m   No Known Allergies  Physical Exam  Constitutional: He appears well-developed and well-nourished. No distress.  HENT:  Head: Normocephalic and atraumatic.  Eyes: Pupils are equal, round, and reactive to light. Conjunctivae and EOM are normal.  Cardiovascular: Normal rate, regular rhythm and normal heart sounds.   Pulmonary/Chest: Effort normal and breath sounds normal. No  respiratory distress.  Skin: Skin is warm and dry.  Psychiatric: He has a normal mood and affect. His behavior is normal.  Nursing note and vitals reviewed.       Assessment & Plan:   1. Gastroenteritis - promethazine (PHENERGAN) 25 MG tablet; Take 1 tablet (25 mg total) by mouth every 8 (eight) hours as needed for nausea or vomiting.  Dispense: 20 tablet; Refill: 0    Current Outpatient Prescriptions:  .  acetaminophen (TYLENOL) 500 MG tablet, Take 1,000 mg by mouth every 6 (six) hours as needed for moderate pain., Disp: , Rfl:  .  amLODipine (NORVASC) 10 MG tablet, Take 1 tablet (10 mg total) by mouth daily., Disp: 90 tablet, Rfl: 3 .  promethazine (PHENERGAN) 25 MG tablet, Take 1 tablet (25 mg total) by mouth every 8 (eight) hours as needed for nausea  or vomiting., Disp: 20 tablet, Rfl: 0 Continue all other maintenance medications as listed above.  Follow up plan: Return if symptoms worsen or fail to improve.  Educational handout given for [place] PA-C [place] [address]  [place], [place] [address] [phone]   08/30/2017, 4:22 PM

## 2017-08-30 NOTE — Patient Instructions (Signed)
In a few days you may receive a survey in the mail or online from [employer] regarding your visit with us today. Please take a moment to fill this out. Your feedback is very important to our whole office. It can help us better understand your needs as well as improve your experience and satisfaction. Thank you for taking your time to complete it. We care about you.  Brodin Gelpi, PA-C  

## 2017-09-02 ENCOUNTER — Telehealth: Payer: Self-pay | Admitting: Family Medicine

## 2017-09-02 NOTE — Telephone Encounter (Signed)
Patient aware that letter is ready for pick up.

## 2017-09-02 NOTE — Telephone Encounter (Signed)
Patient states that he is still having diarrhea with no vomiting.  He has tried eating some solid food and can not keep it in.   Patient would like to have work note extended through 10/2

## 2017-09-02 NOTE — Telephone Encounter (Signed)
That is okay to write note

## 2018-08-25 ENCOUNTER — Telehealth: Payer: Self-pay | Admitting: Family Medicine

## 2018-08-25 NOTE — Telephone Encounter (Signed)
Pt contacted by billing for bad debt Pt informed he would need to be seen for refill Pt declined appt at this time

## 2018-08-25 NOTE — Telephone Encounter (Signed)
BP is 141/105 this morning. Patient has ran out of BP meds needs some called in he has been out for 2 weeks. Please advise. Last OV 08/30/17. Patient using [facility] wants 30 day supply. Please advise.

## 2018-10-06 ENCOUNTER — Other Ambulatory Visit (HOSPITAL_COMMUNITY): Payer: Self-pay | Admitting: Respiratory Therapy

## 2018-10-06 DIAGNOSIS — R0602 Shortness of breath: Secondary | ICD-10-CM

## 2018-11-12 ENCOUNTER — Ambulatory Visit (HOSPITAL_COMMUNITY)
Admission: RE | Admit: 2018-11-12 | Discharge: 2018-11-12 | Disposition: A | Discharge status: Home / Self Care | Payer: PRIVATE HEALTH INSURANCE | Source: Ambulatory Visit | Attending: Internal Medicine | Admitting: Internal Medicine

## 2018-11-12 DIAGNOSIS — R0602 Shortness of breath: Secondary | ICD-10-CM | POA: Diagnosis present

## 2018-11-12 LAB — PULMONARY FUNCTION TEST
DL/VA % pred: 72 %
DL/VA: 3.11 ml/min/mmHg/L
DLCO unc % pred: 54 %
DLCO unc: 14.07 ml/min/mmHg
FEF 25-75 POST: 1.57 L/s
FEF 25-75 Pre: 1.9 L/sec
FEF2575-%Change-Post: -17 %
FEF2575-%PRED-PRE: 66 %
FEF2575-%Pred-Post: 54 %
FEV1-%Change-Post: -3 %
FEV1-%PRED-PRE: 75 %
FEV1-%Pred-Post: 73 %
FEV1-Post: 2.36 L
FEV1-Pre: 2.43 L
FEV1FVC-%Change-Post: 3 %
FEV1FVC-%Pred-Pre: 96 %
FEV6-%CHANGE-POST: -5 %
FEV6-%Pred-Post: 76 %
FEV6-%Pred-Pre: 80 %
FEV6-POST: 3.04 L
FEV6-Pre: 3.22 L
FEV6FVC-%Change-Post: 0 %
FEV6FVC-%PRED-PRE: 103 %
FEV6FVC-%Pred-Post: 103 %
FVC-%Change-Post: -6 %
FVC-%PRED-PRE: 78 %
FVC-%Pred-Post: 73 %
FVC-PRE: 3.26 L
FVC-Post: 3.06 L
POST FEV6/FVC RATIO: 100 %
PRE FEV6/FVC RATIO: 99 %
Post FEV1/FVC ratio: 77 %
Pre FEV1/FVC ratio: 75 %
RV % PRED: 115 %
RV: 2.1 L
TLC % pred: 88 %
TLC: 5.3 L

## 2018-11-12 MED ORDER — ALBUTEROL SULFATE (2.5 MG/3ML) 0.083% IN NEBU
2.5000 mg | INHALATION_SOLUTION | Freq: Once | RESPIRATORY_TRACT | Status: AC
  Administered 2018-11-12: 2.5 mg via RESPIRATORY_TRACT

## 2019-02-27 ENCOUNTER — Encounter (INDEPENDENT_AMBULATORY_CARE_PROVIDER_SITE_OTHER): Payer: Self-pay | Admitting: *Deleted

## 2019-06-25 ENCOUNTER — Telehealth (INDEPENDENT_AMBULATORY_CARE_PROVIDER_SITE_OTHER): Payer: Self-pay | Admitting: *Deleted

## 2019-06-25 ENCOUNTER — Encounter (INDEPENDENT_AMBULATORY_CARE_PROVIDER_SITE_OTHER): Payer: Self-pay | Admitting: *Deleted

## 2019-06-25 NOTE — Telephone Encounter (Signed)
Referring MD/PCP: hall   Procedure: tcs  Reason/Indication:  Hx polyps, fam hx colon ca  Has patient had this procedure before?  Yes, 2017  If so, when, by whom and where?    Is there a family history of colon cancer?  Yes,father  Who?  What age when diagnosed?    Is patient diabetic?   non      Does patient have prosthetic heart valve or mechanical valve?  on  Do you have a pacemaker?  ono  Has patient ever had endocarditis? no  Has patient had joint replacement within last 12 months?  no  Is patient constipated or do they take laxatives? no  Does patient have a history of alcohol/drug use?  no  Is patient on blood thinner such as Coumadin, Plavix and/or Aspirin? no  Medications: lisinopril 30 mg daily, atorvastatin 40 mg daily, omeprazole 40 mg daily  Allergies: nkda  Medication Adjustment per Dr Lindi Adie, NP:   Procedure date & time:

## 2019-07-15 ENCOUNTER — Other Ambulatory Visit (INDEPENDENT_AMBULATORY_CARE_PROVIDER_SITE_OTHER): Payer: Self-pay | Admitting: *Deleted

## 2019-07-15 DIAGNOSIS — Z86010 Personal history of colonic polyps: Secondary | ICD-10-CM

## 2019-07-15 DIAGNOSIS — Z800 Family history of malignant neoplasm of digestive organs: Secondary | ICD-10-CM | POA: Insufficient documentation

## 2019-08-05 ENCOUNTER — Encounter (INDEPENDENT_AMBULATORY_CARE_PROVIDER_SITE_OTHER): Payer: Self-pay | Admitting: *Deleted

## 2019-08-05 ENCOUNTER — Telehealth (INDEPENDENT_AMBULATORY_CARE_PROVIDER_SITE_OTHER): Payer: Self-pay | Admitting: *Deleted

## 2019-08-05 DIAGNOSIS — Z86010 Personal history of colonic polyps: Secondary | ICD-10-CM

## 2019-08-05 MED ORDER — PEG 3350-KCL-NA BICARB-NACL 420 G PO SOLR: 4000.0000 mL | Freq: Once | ORAL | 0 refills | Status: AC

## 2019-08-05 NOTE — Telephone Encounter (Signed)
Patient needs trilyte TCS sch'd 10/8

## 2019-08-12 ENCOUNTER — Ambulatory Visit (INDEPENDENT_AMBULATORY_CARE_PROVIDER_SITE_OTHER): Payer: Self-pay

## 2019-08-12 ENCOUNTER — Telehealth (INDEPENDENT_AMBULATORY_CARE_PROVIDER_SITE_OTHER): Payer: Self-pay | Admitting: *Deleted

## 2019-08-12 ENCOUNTER — Other Ambulatory Visit: Payer: Self-pay

## 2019-08-12 NOTE — Telephone Encounter (Signed)
Referring MD/PCP: hall   Procedure: tcs  Reason/Indication:  Hx polyps, fam hx colon ca  Has patient had this procedure before?  Yes, 2017             If so, when, by whom and where?    Is there a family history of colon cancer?  Yes,father             Who?  What age when diagnosed?    Is patient diabetic?   no                                                  Does patient have prosthetic heart valve or mechanical valve?  no  Do you have a pacemaker?  no  Has patient ever had endocarditis? no  Has patient had joint replacement within last 12 months?  no  Is patient constipated or do they take laxatives? no  Does patient have a history of alcohol/drug use?  no  Is patient on blood thinner such as Coumadin, Plavix and/or Aspirin? no  Medications: lisinopril 30 mg daily, atorvastatin 40 mg daily, omeprazole 40 mg daily  Allergies: nkda  Medication Adjustment per Dr Lindi Adie, NP:   Procedure date & time: 09/10/19 at [id number]

## 2019-08-17 NOTE — Telephone Encounter (Signed)
Okay to schedule colonoscopy with conscious sedation 

## 2019-09-04 ENCOUNTER — Other Ambulatory Visit (HOSPITAL_COMMUNITY)
Admission: RE | Admit: 2019-09-04 | Discharge: 2019-09-04 | Disposition: A | Discharge status: Home / Self Care | Payer: PRIVATE HEALTH INSURANCE | Source: Ambulatory Visit | Attending: Internal Medicine | Admitting: Internal Medicine

## 2019-09-04 ENCOUNTER — Other Ambulatory Visit: Payer: Self-pay

## 2019-09-04 DIAGNOSIS — Z01812 Encounter for preprocedural laboratory examination: Secondary | ICD-10-CM | POA: Insufficient documentation

## 2019-09-04 DIAGNOSIS — Z20828 Contact with and (suspected) exposure to other viral communicable diseases: Secondary | ICD-10-CM | POA: Insufficient documentation

## 2019-09-05 LAB — SARS CORONAVIRUS 2 (TAT 6-24 HRS): SARS Coronavirus 2: NEGATIVE

## 2019-09-07 ENCOUNTER — Ambulatory Visit (HOSPITAL_COMMUNITY)
Admission: RE | Admit: 2019-09-07 | Discharge: 2019-09-07 | Disposition: A | Discharge status: Home / Self Care | Payer: PRIVATE HEALTH INSURANCE | Attending: Internal Medicine | Admitting: Internal Medicine

## 2019-09-07 ENCOUNTER — Encounter (HOSPITAL_COMMUNITY): Payer: Self-pay | Admitting: *Deleted

## 2019-09-07 ENCOUNTER — Other Ambulatory Visit: Payer: Self-pay

## 2019-09-07 ENCOUNTER — Encounter (HOSPITAL_COMMUNITY)
Admission: RE | Disposition: A | Discharge status: Home / Self Care | Payer: Self-pay | Source: Home / Self Care | Attending: Internal Medicine

## 2019-09-07 DIAGNOSIS — Q439 Congenital malformation of intestine, unspecified: Secondary | ICD-10-CM | POA: Diagnosis not present

## 2019-09-07 DIAGNOSIS — K644 Residual hemorrhoidal skin tags: Secondary | ICD-10-CM | POA: Diagnosis not present

## 2019-09-07 DIAGNOSIS — D123 Benign neoplasm of transverse colon: Secondary | ICD-10-CM

## 2019-09-07 DIAGNOSIS — D122 Benign neoplasm of ascending colon: Secondary | ICD-10-CM | POA: Insufficient documentation

## 2019-09-07 DIAGNOSIS — Z79899 Other long term (current) drug therapy: Secondary | ICD-10-CM | POA: Insufficient documentation

## 2019-09-07 DIAGNOSIS — D125 Benign neoplasm of sigmoid colon: Secondary | ICD-10-CM

## 2019-09-07 DIAGNOSIS — K633 Ulcer of intestine: Secondary | ICD-10-CM | POA: Diagnosis not present

## 2019-09-07 DIAGNOSIS — K5730 Diverticulosis of large intestine without perforation or abscess without bleeding: Secondary | ICD-10-CM | POA: Diagnosis not present

## 2019-09-07 DIAGNOSIS — Z791 Long term (current) use of non-steroidal anti-inflammatories (NSAID): Secondary | ICD-10-CM | POA: Diagnosis not present

## 2019-09-07 DIAGNOSIS — Z800 Family history of malignant neoplasm of digestive organs: Secondary | ICD-10-CM

## 2019-09-07 DIAGNOSIS — I10 Essential (primary) hypertension: Secondary | ICD-10-CM | POA: Insufficient documentation

## 2019-09-07 DIAGNOSIS — F17210 Nicotine dependence, cigarettes, uncomplicated: Secondary | ICD-10-CM | POA: Insufficient documentation

## 2019-09-07 DIAGNOSIS — Z09 Encounter for follow-up examination after completed treatment for conditions other than malignant neoplasm: Secondary | ICD-10-CM | POA: Insufficient documentation

## 2019-09-07 DIAGNOSIS — E7800 Pure hypercholesterolemia, unspecified: Secondary | ICD-10-CM | POA: Insufficient documentation

## 2019-09-07 DIAGNOSIS — K635 Polyp of colon: Secondary | ICD-10-CM | POA: Diagnosis not present

## 2019-09-07 DIAGNOSIS — Z86010 Personal history of colonic polyps: Secondary | ICD-10-CM | POA: Diagnosis not present

## 2019-09-07 HISTORY — PX: BIOPSY: SHX5522

## 2019-09-07 HISTORY — PX: POLYPECTOMY: SHX5525

## 2019-09-07 HISTORY — PX: COLONOSCOPY: SHX5424

## 2019-09-07 SURGERY — COLONOSCOPY
Anesthesia: Moderate Sedation

## 2019-09-07 MED ORDER — HYDROCODONE-ACETAMINOPHEN 5-325 MG PO TABS: 1.0000 | ORAL_TABLET | Freq: Three times a day (TID) | ORAL | 0 refills | Status: DC | PRN

## 2019-09-07 MED ORDER — MEPERIDINE HCL 50 MG/ML IJ SOLN
INTRAMUSCULAR | Status: AC
  Filled 2019-09-07: qty 1

## 2019-09-07 MED ORDER — STERILE WATER FOR IRRIGATION IR SOLN
Status: DC | PRN
  Administered 2019-09-07: 2.5 mL

## 2019-09-07 MED ORDER — SODIUM CHLORIDE 0.9 % IV SOLN
INTRAVENOUS | Status: DC
  Administered 2019-09-07: 09:00:00 via INTRAVENOUS

## 2019-09-07 MED ORDER — MIDAZOLAM HCL 5 MG/5ML IJ SOLN
INTRAMUSCULAR | Status: DC | PRN
  Administered 2019-09-07: 1 mg via INTRAVENOUS
  Administered 2019-09-07 (×2): 2 mg via INTRAVENOUS
  Administered 2019-09-07 (×5): 1 mg via INTRAVENOUS

## 2019-09-07 MED ORDER — MEPERIDINE HCL 50 MG/ML IJ SOLN
INTRAMUSCULAR | Status: DC | PRN
  Administered 2019-09-07 (×3): 25 mg via INTRAVENOUS

## 2019-09-07 MED ORDER — MIDAZOLAM HCL 5 MG/5ML IJ SOLN
INTRAMUSCULAR | Status: AC
  Filled 2019-09-07: qty 10

## 2019-09-07 NOTE — Op Note (Addendum)
[facility] Patient Name: George Estes Procedure Date: 09/07/2019 9:06 AM MRN: [id number] Date of Birth: 04/10/1966 Attending MD: Hildred Laser , MD CSN: [id number] Age: 53 Admit Type: Outpatient Procedure:                Colonoscopy Indications:              High risk colon cancer surveillance: Personal                            history of colonic polyps Providers:                Hildred Laser, MD, Otis Peak B. Sharon Seller, RN, Raphael Gibney, Technician Referring MD:             Delphina Cahill, MD Medicines:                Meperidine 75 mg IV, Midazolam 10 mg IV Complications:            No immediate complications. Estimated Blood Loss:     Estimated blood loss was minimal. Procedure:                Pre-Anesthesia Assessment:                           - Prior to the procedure, a History and Physical                            was performed, and patient medications and                            allergies were reviewed. The patient's tolerance of                            previous anesthesia was also reviewed. The risks                            and benefits of the procedure and the sedation                            options and risks were discussed with the patient.                            All questions were answered, and informed consent                            was obtained. Prior Anticoagulants: The patient has                            taken no previous anticoagulant or antiplatelet                            agents except for NSAID medication. ASA Grade  Assessment: II - A patient with mild systemic                            disease. After reviewing the risks and benefits,                            the patient was deemed in satisfactory condition to                            undergo the procedure.                           After obtaining informed consent, the colonoscope                            was passed under  direct vision. Throughout the                            procedure, the patient's blood pressure, pulse, and                            oxygen saturations were monitored continuously. The                            [id number]) scope was introduced through                            the anus and advanced to the the cecum, identified                            by appendiceal orifice and ileocecal valve. The                            colonoscopy was somewhat difficult due to a                            tortuous colon. Successful completion of the                            procedure was aided by changing the patient to a                            supine position, using manual pressure and                            withdrawing and reinserting the scope. The patient                            tolerated the procedure well. The quality of the                            bowel preparation was adequate. The ileocecal  valve, appendiceal orifice, and rectum were                            photographed. Scope In: 9:31:43 AM Scope Out: 10:34:53 AM Scope Withdrawal Time: 0 hours 56 minutes 1 second  Total Procedure Duration: 1 hour 3 minutes 10 seconds  Findings:      The perianal and digital rectal examinations were normal.      Three sessile polyps were found in the hepatic flexure and ascending       colon. The polyps were small in size. These polyps were removed with a       cold snare. Resection and retrieval were complete. The pathology       specimen was placed into Bottle Number 1.      A 5 to 12 mm polyp was found in the ascending colon. The polyp was flat.       Coagulation for hemostasis using argon plasma was successful. To stop       active bleeding, one hemostatic clip was successfully placed (MR       conditional). There was no bleeding at the end of the procedure. The       pathology specimen was placed into Bottle Number 1.      A single  (solitary) ten mm ulcer was found at the splenic flexure. No       bleeding was present. Biopsies were taken with a cold forceps for       histology. The pathology specimen was placed into Bottle Number 3.      A 5 mm polyp was found in the mid sigmoid colon. The polyp was       pedunculated. The polyp was removed with a hot snare. Resection and       retrieval were complete. The pathology specimen was placed into Bottle       Number 3.      A few small-mouthed diverticula were found in the sigmoid colon.      External hemorrhoids were found. Impression:               - Three small polyps at the hepatic flexure and in                            the ascending colon, removed with a cold snare.                            Resected and retrieved.                           - One 5 to 12 mm polyp in the ascending colon.                            Treated with argon plasma coagulation (APC). Clip                            (MR conditional) was placed.                           - A single (solitary) ulcer at the splenic flexure.  Biopsied. ? ulcerated leiomyoma.                           - One 5 mm polyp in the mid sigmoid colon, removed                            with a hot snare. Resected and retrieved.                           - Diverticulosis in the sigmoid colon.                           - External hemorrhoids.                           comment: patient had 5 polyps this time. Moderate Sedation:      Moderate (conscious) sedation was administered by the endoscopy nurse       and supervised by the endoscopist. The following parameters were       monitored: oxygen saturation, heart rate, blood pressure, CO2       capnography and response to care. Total physician intraservice time was       69 minutes. Recommendation:           - Patient has a contact number available for                            emergencies. The signs and symptoms of potential                             delayed complications were discussed with the                            patient. Return to normal activities tomorrow.                            Written discharge instructions were provided to the                            patient.                           - High fiber diet today.                           - No aspirin, ibuprofen, naproxen, or other                            non-steroidal anti-inflammatory drugs for 7 days.                           - Await pathology results.                           - Repeat colonoscopy in 3 years for surveillance. Procedure Code(s):        --- Professional ---  [id number], 59, Colonoscopy, flexible; with control of                            bleeding, any method                           45385, Colonoscopy, flexible; with removal of                            tumor(s), polyp(s), or other lesion(s) by snare                            technique                           45380, 59, Colonoscopy, flexible; with biopsy,                            single or multiple                           99153, Moderate sedation; each additional 15                            minutes intraservice time                           99153, Moderate sedation; each additional 15                            minutes intraservice time                           99153, Moderate sedation; each additional 15                            minutes intraservice time                           99153, Moderate sedation; each additional 15                            minutes intraservice time                           G0500, Moderate sedation services provided by the                            same physician or other qualified health care                            professional performing a gastrointestinal                            endoscopic service that sedation supports,                            requiring the  presence of an independent trained                             observer to assist in the monitoring of the                            patient's level of consciousness and physiological                            status; initial 15 minutes of intra-service time;                            patient age 38 years or older (additional time may                            be reported with [phone], as appropriate) Diagnosis Code(s):        --- Professional ---                           K63.5, Polyp of colon                           Z86.010, Personal history of colonic polyps                           K63.3, Ulcer of intestine                           K64.4, Residual hemorrhoidal skin tags                           K57.30, Diverticulosis of large intestine without                            perforation or abscess without bleeding CPT copyright 2019 American [employer] Association. All rights reserved. The codes documented in this report are preliminary and upon coder review may  be revised to meet current compliance requirements. Hildred Laser, MD Hildred Laser, MD 09/07/2019 10:52:45 AM This report has been signed electronically. Number of Addenda: 0

## 2019-09-07 NOTE — H&P (Signed)
George Estes is an 53 y.o. male.   Chief Complaint: Patient is here for colonoscopy. HPI: Patient 53 year old Caucasian male who underwent colonoscopy in April 2017 with removal of 9 polyps and all of these were tubular adenomas.  He is here for surveillance colonoscopy.  He has regular bowel movements.  He did notice a small amount of blood with his bowel movement few days ago.  He is on diclofenac 75 mg daily.  He does not take aspirin. Family history is positive for CRC in father who was diagnosed with stage IV colon carcinoma at age 51 and died 2 years later.  Past Medical History:  Diagnosis Date  . Hepatitis C antibody test positive; HCV RNA -2016.   . High cholesterol   . Hypertension     Past Surgical History:  Procedure Laterality Date  . COLONOSCOPY N/A 03/28/2016   Procedure: COLONOSCOPY;  Surgeon: Rogene Houston, MD;  Location: [facility];  Service: Endoscopy;  Laterality: N/A;  1030    Family History  Problem Relation Age of Onset  . Cancer Father        colon  . Cancer Maternal Uncle   . Cancer Maternal Grandfather    Social History:  reports that he has been smoking. He has a 30.00 pack-year smoking history. He has never used smokeless tobacco. He reports that he does not drink alcohol or use drugs.  Allergies: No Known Allergies  Medications Prior to Admission  Medication Sig Dispense Refill  . atorvastatin (LIPITOR) 40 MG tablet Take 40 mg by mouth daily.    . diclofenac (VOLTAREN) 75 MG EC tablet Take 75 mg by mouth daily.     Marland Kitchen lisinopril (ZESTRIL) 30 MG tablet Take 30 mg by mouth daily.    Marland Kitchen omeprazole (PRILOSEC) 40 MG capsule Take 40 mg by mouth daily.      No results found for this or any previous visit (from the past 48 hour(s)). No results found.  ROS  Blood pressure 128/81, temperature 98.1 F (36.7 C), temperature source Oral, resp. rate 16, height 5\' 5"  (1.651 m), weight 79.8 kg, SpO2 96 %. Physical Exam  Constitutional: He appears  well-developed and well-nourished.  HENT:  Mouth/Throat: Oropharynx is clear and moist.  Eyes: Conjunctivae are normal. No scleral icterus.  Neck: No thyromegaly present.  Cardiovascular: Normal rate, regular rhythm and normal heart sounds.  No murmur heard. Respiratory: Effort normal and breath sounds normal.  GI:  Abdomen is full.  It is soft and nontender without organomegaly or masses.  Musculoskeletal:        General: No edema.  Lymphadenopathy:    He has no cervical adenopathy.  Neurological: He is alert.  Skin: Skin is warm and dry.     Assessment/Plan History of colonic adenomas. History of CRC in first-degree relative at late onset. Surveillance colonoscopy.  Hildred Laser, MD 09/07/2019, 9:20 AM

## 2019-09-07 NOTE — Discharge Instructions (Signed)
Colonoscopy, Adult, Care After This sheet gives you information about how to care for yourself after your procedure. Your health care provider may also give you more specific instructions. If you have problems or questions, contact your health care provider. What can I expect after the procedure? After the procedure, it is common to have:  A small amount of blood in your stool for 24 hours after the procedure.  Some gas.  Mild abdominal cramping or bloating. Follow these instructions at home: General instructions  For the first 24 hours after the procedure: ? Do not drive or use machinery. ? Do not sign important documents. ? Do not drink alcohol. ? Do your regular daily activities at a slower pace than normal. ? Eat soft, easy-to-digest foods.  Take over-the-counter or prescription medicines only as told by your health care provider. Relieving cramping and bloating   Try walking around when you have cramps or feel bloated.  Apply heat to your abdomen as told by your health care provider. Use a heat source that your health care provider recommends, such as a moist heat pack or a heating pad. ? Place a towel between your skin and the heat source. ? Leave the heat on for 20-30 minutes. ? Remove the heat if your skin turns bright red. This is especially important if you are unable to feel pain, heat, or cold. You may have a greater risk of getting burned. Eating and drinking   Drink enough fluid to keep your urine pale yellow.  Resume your normal diet as instructed by your health care provider. Avoid heavy or fried foods that are hard to digest.  Avoid drinking alcohol for as long as instructed by your health care provider. Contact a health care provider if:  You have blood in your stool 2-3 days after the procedure. Get help right away if:  You have more than a small spotting of blood in your stool.  You pass large blood clots in your stool.  Your abdomen is  swollen.  You have nausea or vomiting.  You have a fever.  You have increasing abdominal pain that is not relieved with medicine. Summary  After the procedure, it is common to have a small amount of blood in your stool. You may also have mild abdominal cramping and bloating.  For the first 24 hours after the procedure, do not drive or use machinery, sign important documents, or drink alcohol.  Contact your health care provider if you have a lot of blood in your stool, nausea or vomiting, a fever, or increased abdominal pain. This information is not intended to replace advice given to you by your health care provider. Make sure you discuss any questions you have with your health care provider. Document Released: 07/03/2004 Document Revised: 09/11/2017 Document Reviewed: 01/31/2016 Elsevier Patient Education  [place].    Colon Polyps  Polyps are tissue growths inside the body. Polyps can grow in many places, including the large intestine (colon). A polyp may be a round bump or a mushroom-shaped growth. You could have one polyp or several. Most colon polyps are noncancerous (benign). However, some colon polyps can become cancerous over time. Finding and removing the polyps early can help prevent this. What are the causes? The exact cause of colon polyps is not known. What increases the risk? You are more likely to develop this condition if you:  Have a family history of colon cancer or colon polyps.  Are older than 50 or older than  45 if you are African American.  Have inflammatory bowel disease, such as ulcerative colitis or Crohn's disease.  Have certain hereditary conditions, such as: ? Familial adenomatous polyposis. ? Lynch syndrome. ? Turcot syndrome. ? Peutz-Jeghers syndrome.  Are overweight.  Smoke cigarettes.  Do not get enough exercise.  Drink too much alcohol.  Eat a diet that is high in fat and red meat and low in fiber.  Had childhood cancer  that was treated with abdominal radiation. What are the signs or symptoms? Most polyps do not cause symptoms. If you have symptoms, they may include:  Blood coming from your rectum when having a bowel movement.  Blood in your stool. The stool may look dark red or black.  Abdominal pain.  A change in bowel habits, such as constipation or diarrhea. How is this diagnosed? This condition is diagnosed with a colonoscopy. This is a procedure in which a lighted, flexible scope is inserted into the anus and then passed into the colon to examine the area. Polyps are sometimes found when a colonoscopy is done as part of routine cancer screening tests. How is this treated? Treatment for this condition involves removing any polyps that are found. Most polyps can be removed during a colonoscopy. Those polyps will then be tested for cancer. Additional treatment may be needed depending on the results of testing. Follow these instructions at home: Lifestyle  Maintain a healthy weight, or lose weight if recommended by your health care provider.  Exercise every day or as told by your health care provider.  Do not use any products that contain nicotine or tobacco, such as cigarettes and e-cigarettes. If you need help quitting, ask your health care provider.  If you drink alcohol, limit how much you have: ? 0-1 drink a day for women. ? 0-2 drinks a day for men.  Be aware of how much alcohol is in your drink. In the [place] one drink equals one 12 oz bottle of beer (355 mL), one 5 oz glass of wine (148 mL), or one 1 oz shot of hard liquor (44 mL). Eating and drinking   Eat foods that are high in fiber, such as fruits, vegetables, and whole grains.  Eat foods that are high in calcium and vitamin D, such as milk, cheese, yogurt, eggs, liver, fish, and broccoli.  Limit foods that are high in fat, such as fried foods and desserts.  Limit the amount of red meat and processed meat you eat, such as hot  dogs, sausage, bacon, and lunch meats. General instructions  Keep all follow-up visits as told by your health care provider. This is important. ? This includes having regularly scheduled colonoscopies. ? Talk to your health care provider about when you need a colonoscopy. Contact a health care provider if:  You have new or worsening bleeding during a bowel movement.  You have new or increased blood in your stool.  You have a change in bowel habits.  You lose weight for no known reason. Summary  Polyps are tissue growths inside the body. Polyps can grow in many places, including the colon.  Most colon polyps are noncancerous (benign), but some can become cancerous over time.  This condition is diagnosed with a colonoscopy.  Treatment for this condition involves removing any polyps that are found. Most polyps can be removed during a colonoscopy. This information is not intended to replace advice given to you by your health care provider. Make sure you discuss any questions you have with  your health care provider. Document Released: 08/15/2004 Document Revised: 03/06/2018 Document Reviewed: 03/06/2018 Elsevier Patient Education  [employer].  High-Fiber Diet Fiber, also called dietary fiber, is a type of carbohydrate that is found in fruits, vegetables, whole grains, and beans. A high-fiber diet can have many health benefits. Your health care provider may recommend a high-fiber diet to help:  Prevent constipation. Fiber can make your bowel movements more regular.  Lower your cholesterol.  Relieve the following conditions: ? Swelling of veins in the anus (hemorrhoids). ? Swelling and irritation (inflammation) of specific areas of the digestive tract (uncomplicated diverticulosis). ? A problem of the large intestine (colon) that sometimes causes pain and diarrhea (irritable bowel syndrome, IBS).  Prevent overeating as part of a weight-loss plan.  Prevent heart disease,  type 2 diabetes, and certain cancers. What is my plan? The recommended daily fiber intake in grams (g) includes:  38 g for men age 75 or younger.  30 g for men over age 57.  66 g for women age 62 or younger.  21 g for women over age 28. You can get the recommended daily intake of dietary fiber by:  Eating a variety of fruits, vegetables, grains, and beans.  Taking a fiber supplement, if it is not possible to get enough fiber through your diet. What do I need to know about a high-fiber diet?  It is better to get fiber through food sources rather than from fiber supplements. There is not a lot of research about how effective supplements are.  Always check the fiber content on the nutrition facts label of any prepackaged food. Look for foods that contain 5 g of fiber or more per serving.  Talk with a diet and nutrition specialist (dietitian) if you have questions about specific foods that are recommended or not recommended for your medical condition, especially if those foods are not listed below.  Gradually increase how much fiber you consume. If you increase your intake of dietary fiber too quickly, you may have bloating, cramping, or gas.  Drink plenty of water. Water helps you to digest fiber. What are tips for following this plan?  Eat a wide variety of high-fiber foods.  Make sure that half of the grains that you eat each day are whole grains.  Eat breads and cereals that are made with whole-grain flour instead of refined flour or white flour.  Eat brown rice, bulgur wheat, or millet instead of white rice.  Start the day with a breakfast that is high in fiber, such as a cereal that contains 5 g of fiber or more per serving.  Use beans in place of meat in soups, salads, and pasta dishes.  Eat high-fiber snacks, such as berries, raw vegetables, nuts, and popcorn.  Choose whole fruits and vegetables instead of processed forms like juice or sauce. What foods can I  eat?  Fruits Berries. Pears. Apples. Oranges. Avocado. Prunes and raisins. Dried figs. Vegetables Sweet potatoes. Spinach. Kale. Artichokes. Cabbage. Broccoli. Cauliflower. Green peas. Carrots. Squash. Grains Whole-grain breads. Multigrain cereal. Oats and oatmeal. Brown rice. Barley. Bulgur wheat. [place]. Quinoa. Bran muffins. Popcorn. Rye wafer crackers. Meats and other proteins Navy, kidney, and pinto beans. Soybeans. Split peas. Lentils. Nuts and seeds. Dairy Fiber-fortified yogurt. Beverages Fiber-fortified soy milk. Fiber-fortified orange juice. Other foods Fiber bars. The items listed above may not be a complete list of recommended foods and beverages. Contact a dietitian for more options. What foods are not recommended? Fruits Fruit juice. Cooked,  strained fruit. Vegetables Fried potatoes. Canned vegetables. Well-cooked vegetables. Grains White bread. Pasta made with refined flour. White rice. Meats and other proteins Fatty cuts of meat. Fried chicken or fried fish. Dairy Milk. Yogurt. Cream cheese. Sour cream. Fats and oils Butters. Beverages Soft drinks. Other foods Cakes and pastries. The items listed above may not be a complete list of foods and beverages to avoid. Contact a dietitian for more information. Summary  Fiber is a type of carbohydrate. It is found in fruits, vegetables, whole grains, and beans.  There are many health benefits of eating a high-fiber diet, such as preventing constipation, lowering blood cholesterol, helping with weight loss, and reducing your risk of heart disease, diabetes, and certain cancers.  Gradually increase your intake of fiber. Increasing too fast can result in cramping, bloating, and gas. Drink plenty of water while you increase your fiber.  The best sources of fiber include whole fruits and vegetables, whole grains, nuts, seeds, and beans. This information is not intended to replace advice given to you by your health care  provider. Make sure you discuss any questions you have with your health care provider. Document Released: 11/19/2005 Document Revised: 09/23/2017 Document Reviewed: 09/23/2017 Elsevier Patient Education  [place].  No aspirin or NSAIDs for 1 week. Resume other medications as before. Hydrocodone/acetaminophen 5/325 1 tablet 3 times daily as needed joint or back pain. High-fiber diet. No driving for 24 hours. Physician will call with biopsy results and further recommendations.

## 2019-09-08 ENCOUNTER — Other Ambulatory Visit (HOSPITAL_COMMUNITY): Payer: PRIVATE HEALTH INSURANCE

## 2019-09-08 LAB — SURGICAL PATHOLOGY

## 2019-09-10 ENCOUNTER — Encounter (INDEPENDENT_AMBULATORY_CARE_PROVIDER_SITE_OTHER): Payer: Self-pay

## 2019-09-14 ENCOUNTER — Encounter (HOSPITAL_COMMUNITY): Payer: Self-pay | Admitting: Internal Medicine

## 2019-09-15 ENCOUNTER — Ambulatory Visit (INDEPENDENT_AMBULATORY_CARE_PROVIDER_SITE_OTHER): Payer: PRIVATE HEALTH INSURANCE | Admitting: Internal Medicine

## 2019-09-15 ENCOUNTER — Other Ambulatory Visit: Payer: Self-pay | Admitting: *Deleted

## 2019-09-15 DIAGNOSIS — Z20822 Contact with and (suspected) exposure to covid-19: Secondary | ICD-10-CM

## 2019-09-17 LAB — NOVEL CORONAVIRUS, NAA: SARS-CoV-2, NAA: NOT DETECTED

## 2022-08-27 ENCOUNTER — Encounter (INDEPENDENT_AMBULATORY_CARE_PROVIDER_SITE_OTHER): Payer: Self-pay | Admitting: *Deleted

## 2022-09-19 ENCOUNTER — Other Ambulatory Visit (INDEPENDENT_AMBULATORY_CARE_PROVIDER_SITE_OTHER): Payer: Self-pay

## 2022-09-19 DIAGNOSIS — Z86010 Personal history of colonic polyps: Secondary | ICD-10-CM

## 2022-09-19 DIAGNOSIS — Z800 Family history of malignant neoplasm of digestive organs: Secondary | ICD-10-CM

## 2022-09-24 ENCOUNTER — Encounter (INDEPENDENT_AMBULATORY_CARE_PROVIDER_SITE_OTHER): Payer: Self-pay

## 2022-09-24 ENCOUNTER — Telehealth (INDEPENDENT_AMBULATORY_CARE_PROVIDER_SITE_OTHER): Payer: Self-pay

## 2022-09-24 MED ORDER — PEG 3350-KCL-NA BICARB-NACL 420 G PO SOLR: 4000.0000 mL | ORAL | 0 refills | Status: DC

## 2022-09-24 NOTE — Telephone Encounter (Signed)
Referring MD/PCP: Dr Wende Neighbors   Procedure: Tcs   Reason/Indication:  Hx of colon polyps, fam hx of colon ca  Has patient had this procedure before?  yes  If so, when, by whom and where?  11/07/2019  Is there a family history of colon cancer?  Yes   Who?  What age when diagnosed?  Father  Is patient diabetic? If yes, Type 1 or Type 2   yes, type 2      Does patient have prosthetic heart valve or mechanical valve?  No  Do you have a pacemaker/defibrillator?  no  Has patient ever had endocarditis/atrial fibrillation? no  Does patient use oxygen? no  Has patient had joint replacement within last 12 months?  no  Is patient constipated or do they take laxatives? no  Does patient have a history of alcohol/drug use?  no  Have you had a stroke/heart attack last 6 mths? no  Do you take medicine for weight loss?  no  For male patients,: have you had a hysterectomy n/a                      are you post menopausal n/a                      do you still have your menstrual cycle n/a  Is patient on blood thinner such as Coumadin, Plavix and/or Aspirin? no  Medications: atorvastatin 40 mg daily, Lisinopril 40 mg daily, Metformin 500 mg daily, Rybelsus 7 mg daily  Allergies: NKDA  Medication Adjustment per Dr Jenetta Downer Hold Rybelsus 7 days prior, No metformin the evening prior or morning of procedure  Procedure date & time: 10/24/22 at 915

## 2022-09-24 NOTE — Telephone Encounter (Signed)
Adaline Trejos Ann Liliauna Santoni, CMA  ?

## 2022-10-22 ENCOUNTER — Telehealth (INDEPENDENT_AMBULATORY_CARE_PROVIDER_SITE_OTHER): Payer: Self-pay | Admitting: *Deleted

## 2022-10-22 NOTE — Telephone Encounter (Signed)
Received call from [facility] that pt insurance needs PA.  Called [employer] and PA done via telephone. Approved for DOS 10/24/22, auth# [id number]

## 2022-10-24 ENCOUNTER — Encounter (HOSPITAL_COMMUNITY)
Admission: RE | Disposition: A | Discharge status: Home / Self Care | Payer: Self-pay | Source: Home / Self Care | Attending: Gastroenterology

## 2022-10-24 ENCOUNTER — Ambulatory Visit (HOSPITAL_BASED_OUTPATIENT_CLINIC_OR_DEPARTMENT_OTHER): Payer: No Typology Code available for payment source | Admitting: Anesthesiology

## 2022-10-24 ENCOUNTER — Other Ambulatory Visit: Payer: Self-pay

## 2022-10-24 ENCOUNTER — Ambulatory Visit (HOSPITAL_COMMUNITY): Payer: No Typology Code available for payment source | Admitting: Anesthesiology

## 2022-10-24 ENCOUNTER — Ambulatory Visit (HOSPITAL_COMMUNITY)
Admission: RE | Admit: 2022-10-24 | Discharge: 2022-10-24 | Disposition: A | Discharge status: Home / Self Care | Payer: No Typology Code available for payment source | Attending: Gastroenterology | Admitting: Gastroenterology

## 2022-10-24 ENCOUNTER — Encounter (INDEPENDENT_AMBULATORY_CARE_PROVIDER_SITE_OTHER): Payer: Self-pay | Admitting: *Deleted

## 2022-10-24 ENCOUNTER — Encounter (HOSPITAL_COMMUNITY): Payer: Self-pay | Admitting: Gastroenterology

## 2022-10-24 DIAGNOSIS — Z86010 Personal history of colonic polyps: Secondary | ICD-10-CM

## 2022-10-24 DIAGNOSIS — D175 Benign lipomatous neoplasm of intra-abdominal organs: Secondary | ICD-10-CM | POA: Diagnosis not present

## 2022-10-24 DIAGNOSIS — D123 Benign neoplasm of transverse colon: Secondary | ICD-10-CM | POA: Insufficient documentation

## 2022-10-24 DIAGNOSIS — K5730 Diverticulosis of large intestine without perforation or abscess without bleeding: Secondary | ICD-10-CM | POA: Diagnosis not present

## 2022-10-24 DIAGNOSIS — K635 Polyp of colon: Secondary | ICD-10-CM | POA: Diagnosis not present

## 2022-10-24 DIAGNOSIS — I10 Essential (primary) hypertension: Secondary | ICD-10-CM | POA: Insufficient documentation

## 2022-10-24 DIAGNOSIS — K644 Residual hemorrhoidal skin tags: Secondary | ICD-10-CM | POA: Insufficient documentation

## 2022-10-24 DIAGNOSIS — F17210 Nicotine dependence, cigarettes, uncomplicated: Secondary | ICD-10-CM | POA: Diagnosis not present

## 2022-10-24 DIAGNOSIS — Z09 Encounter for follow-up examination after completed treatment for conditions other than malignant neoplasm: Secondary | ICD-10-CM | POA: Diagnosis not present

## 2022-10-24 DIAGNOSIS — K649 Unspecified hemorrhoids: Secondary | ICD-10-CM | POA: Diagnosis not present

## 2022-10-24 DIAGNOSIS — K648 Other hemorrhoids: Secondary | ICD-10-CM | POA: Diagnosis not present

## 2022-10-24 DIAGNOSIS — Z1211 Encounter for screening for malignant neoplasm of colon: Secondary | ICD-10-CM | POA: Insufficient documentation

## 2022-10-24 DIAGNOSIS — E785 Hyperlipidemia, unspecified: Secondary | ICD-10-CM | POA: Insufficient documentation

## 2022-10-24 DIAGNOSIS — D125 Benign neoplasm of sigmoid colon: Secondary | ICD-10-CM | POA: Insufficient documentation

## 2022-10-24 DIAGNOSIS — Z800 Family history of malignant neoplasm of digestive organs: Secondary | ICD-10-CM | POA: Diagnosis not present

## 2022-10-24 DIAGNOSIS — D122 Benign neoplasm of ascending colon: Secondary | ICD-10-CM | POA: Diagnosis not present

## 2022-10-24 DIAGNOSIS — R7303 Prediabetes: Secondary | ICD-10-CM | POA: Insufficient documentation

## 2022-10-24 HISTORY — DX: Prediabetes: R73.03

## 2022-10-24 HISTORY — PX: COLONOSCOPY WITH PROPOFOL: SHX5780

## 2022-10-24 HISTORY — PX: POLYPECTOMY: SHX5525

## 2022-10-24 LAB — HM COLONOSCOPY

## 2022-10-24 LAB — GLUCOSE, CAPILLARY: Glucose-Capillary: 106 mg/dL — ABNORMAL HIGH (ref 70–99)

## 2022-10-24 SURGERY — COLONOSCOPY WITH PROPOFOL
Anesthesia: General

## 2022-10-24 MED ORDER — LIDOCAINE HCL (CARDIAC) PF 100 MG/5ML IV SOSY
PREFILLED_SYRINGE | INTRAVENOUS | Status: DC | PRN
  Administered 2022-10-24: 40 mg via INTRAVENOUS

## 2022-10-24 MED ORDER — LACTATED RINGERS IV SOLN: INTRAVENOUS | Status: DC | PRN

## 2022-10-24 MED ORDER — PROPOFOL 500 MG/50ML IV EMUL
INTRAVENOUS | Status: DC | PRN
  Administered 2022-10-24: 200 ug/kg/min via INTRAVENOUS

## 2022-10-24 MED ORDER — PROPOFOL 10 MG/ML IV BOLUS
INTRAVENOUS | Status: DC | PRN
  Administered 2022-10-24: 100 mg via INTRAVENOUS

## 2022-10-24 MED ORDER — LACTATED RINGERS IV SOLN: INTRAVENOUS | Status: DC

## 2022-10-24 NOTE — H&P (Signed)
George Estes is an 56 y.o. male.   Chief Complaint: history of colon polyps HPI: 56 year old male with past medical history of hypertension, hyperlipidemia and prediabetes, coming for follow-up of history of colon polyps. The patient denies having any nausea, vomiting, fever, chills, hematochezia, melena, hematemesis, abdominal distention, abdominal pain, diarrhea, jaundice, pruritus or weight loss.  Patient reports his father was diagnosed bleed colon cancer in his 63s.  - Three small polyps at the hepatic flexure and in the ascending colon, removed with a cold snare. Resected and retrieved. - One 5 to 12 mm polyp in the ascending colon. Treated with argon plasma coagulation (APC). Clip (MR conditional) was placed. - A single (solitary) ulcer at the splenic flexure. Biopsied. ? ulcerated leiomyoma. - One 5 mm polyp in the mid sigmoid colon, removed with a hot snare. Resected and retrieved. - Diverticulosis in the sigmoid colon. - External hemorrhoids.  Path DIAGNOSIS:   A. COLON, ASCENDING, HEPATIC FLEXURE, POLYPECTOMY:  - Tubular adenoma (multiple fragments).  - No high grade dysplasia or malignancy.   B. COLON, SPLENIC FLEXURE, BIOPSY:  -  Hyperplastic polyp (multiple fragments).  -  No dysplasia or malignancy.   C. COLON, SIGMOID, POLYPECTOMY:  - Tubular adenoma.  - No high grade dysplasia or malignancy.   Past Medical History:  Diagnosis Date   Hepatitis C antibody test positive    High cholesterol    Hypertension    Pre-diabetes     Past Surgical History:  Procedure Laterality Date   BIOPSY  09/07/2019   Procedure: BIOPSY;  Surgeon: Rogene Houston, MD;  Location: [facility];  Service: Endoscopy;;  colon   COLONOSCOPY N/A 03/28/2016   Procedure: COLONOSCOPY;  Surgeon: Rogene Houston, MD;  Location: [facility];  Service: Endoscopy;  Laterality: N/A;  1030   COLONOSCOPY N/A 09/07/2019   Procedure: COLONOSCOPY;  Surgeon: Rogene Houston, MD;  Location: [facility];  Service: Endoscopy;  Laterality: N/A;  1155-rescheduled 10/5 @ 8:30am per office   POLYPECTOMY  09/07/2019   Procedure: POLYPECTOMY;  Surgeon: Rogene Houston, MD;  Location: [facility];  Service: Endoscopy;;  colon     Family History  Problem Relation Age of Onset   Cancer Father        colon   Cancer Maternal Uncle    Cancer Maternal Grandfather    Social History:  reports that he has been smoking cigarettes. He has a 30.00 pack-year smoking history. He has never used smokeless tobacco. He reports that he does not drink alcohol and does not use drugs.  Allergies: No Known Allergies  Medications Prior to Admission  Medication Sig Dispense Refill   atorvastatin (LIPITOR) 40 MG tablet Take 40 mg by mouth daily.     lisinopril (ZESTRIL) 40 MG tablet Take 40 mg by mouth daily.     metFORMIN (GLUCOPHAGE) 500 MG tablet Take 500 mg by mouth daily with breakfast.     omeprazole (PRILOSEC) 40 MG capsule Take 40 mg by mouth daily.     RYBELSUS 7 MG TABS Take 7 mg by mouth daily.     polyethylene glycol-electrolytes (TRILYTE) 420 g solution Take 4,000 mLs by mouth as directed. 4000 mL 0    Results for orders placed or performed during the hospital encounter of 10/24/22 (from the past 48 hour(s))  Glucose, capillary     Status: Abnormal   Collection Time: 10/24/22  7:37 AM  Result Value Ref Range   Glucose-Capillary 106 (H)  70 - 99 mg/dL    Comment: Glucose reference range applies only to samples taken after fasting for at least 8 hours.   No results found.  Review of Systems  All other systems reviewed and are negative.   Blood pressure 117/88, pulse 69, temperature 98 F (36.7 C), temperature source Oral, resp. rate 13, height '5\' 5"'$  (1.651 m), weight 74.8 kg, SpO2 97 %. Physical Exam  GENERAL: The patient is AO x3, in no acute distress. HEENT: Head is normocephalic and atraumatic. EOMI are intact. Mouth is well hydrated and without lesions. NECK: Supple. No  masses LUNGS: Clear to auscultation. No presence of rhonchi/wheezing/rales. Adequate chest expansion HEART: RRR, normal s1 and s2. ABDOMEN: Soft, nontender, no guarding, no peritoneal signs, and nondistended. BS +. No masses. EXTREMITIES: Without any cyanosis, clubbing, rash, lesions or edema. NEUROLOGIC: AOx3, no focal motor deficit. SKIN: no jaundice, no rashes  Assessment/Plan 56 year old male with past medical history of hypertension, hyperlipidemia and prediabetes, coming for follow-up of history of colon polyps.  We will proceed with colonoscopy.  Harvel Quale, MD 10/24/2022, 8:54 AM

## 2022-10-24 NOTE — Discharge Instructions (Signed)
You are being discharged to home.  Resume your previous diet.  We are waiting for your pathology results.  Your physician has recommended a repeat colonoscopy in three years for surveillance.  

## 2022-10-24 NOTE — Op Note (Signed)
[facility] Patient Name: George Estes Procedure Date: 10/24/2022 9:31 AM MRN: [id number] Date of Birth: 1966-07-25 Attending MD: Maylon Peppers , , [id number] CSN: [id number] Age: 56 Admit Type: Outpatient Procedure:                Colonoscopy Indications:              Surveillance: Personal history of adenomatous                            polyps on last colonoscopy 3 years ago Providers:                Maylon Peppers, Janeece Riggers, RN, Aram Candela Referring MD:              Medicines:                Monitored Anesthesia Care Complications:            No immediate complications. Estimated Blood Loss:     Estimated blood loss: none. Procedure:                Pre-Anesthesia Assessment:                           - Prior to the procedure, a History and Physical                            was performed, and patient medications, allergies                            and sensitivities were reviewed. The patient's                            tolerance of previous anesthesia was reviewed.                           - The risks and benefits of the procedure and the                            sedation options and risks were discussed with the                            patient. All questions were answered and informed                            consent was obtained.                           - After reviewing the risks and benefits, the                            patient was deemed in satisfactory condition to                            undergo the procedure.                           - ASA Grade Assessment:  II - A patient with mild                            systemic disease.                           After obtaining informed consent, the colonoscope                            was passed under direct vision. Throughout the                            procedure, the patient's blood pressure, pulse, and                            oxygen saturations were monitored continuously. The                             [id number]-HQ190L ([id number]) scope was introduced through                            the anus and advanced to the the cecum, identified                            by appendiceal orifice and ileocecal valve. The                            colonoscopy was performed without difficulty. The                            patient tolerated the procedure well. The quality                            of the bowel preparation was adequate to identify                            polyps greater than 5 mm in size. Scope In: 9:49:19 AM Scope Out: 10:18:01 AM Scope Withdrawal Time: 0 hours 19 minutes 4 seconds  Total Procedure Duration: 0 hours 28 minutes 42 seconds  Findings:      Hemorrhoids were found on perianal exam.      A 5 mm polyp was found in the transverse colon. The polyp was sessile.       The polyp was removed with a cold snare. Resection and retrieval were       complete.      An 8 mm polyp was found in the distal transverse colon. The polyp was       sessile and was overlying a medium sized submucosal lesion (possible       leiomyoma?). The poly was not ulcerated. The polyp was removed with a       hot snare. Resection and retrieval were complete.      External and internal hemorrhoids were found during retroflexion and       during perianal exam. The hemorrhoids were small and medium-sized. Impression:               -  Hemorrhoids found on perianal exam.                           - One 5 mm polyp in the transverse colon, removed                            with a cold snare. Resected and retrieved.                           - One 8 mm polyp in the distal transverse colon                            overlying a submucosal lesion, removed with a hot                            snare. Resected and retrieved.                           - External and internal hemorrhoids. Moderate Sedation:      Per Anesthesia Care Recommendation:           - Discharge patient to home (ambulatory).                            - Resume previous diet.                           - Await pathology results.                           - Repeat colonoscopy in 3 years for surveillance -                            will need a 2 day prep. Procedure Code(s):        --- Professional ---                           [phone], Colonoscopy, flexible; with removal of                            tumor(s), polyp(s), or other lesion(s) by snare                            technique Diagnosis Code(s):        --- Professional ---                           Z86.010, Personal history of colonic polyps                           D12.3, Benign neoplasm of transverse colon (hepatic                            flexure or splenic flexure)  K64.8, Other hemorrhoids CPT copyright 2022 [employer]. All rights reserved. The codes documented in this report are preliminary and upon coder review may  be revised to meet current compliance requirements. Maylon Peppers, MD Maylon Peppers,  10/24/2022 10:24:58 AM This report has been signed electronically. Number of Addenda: 0

## 2022-10-24 NOTE — Anesthesia Preprocedure Evaluation (Signed)
Anesthesia Evaluation  Patient identified by MRN, date of birth, ID band Patient awake    Reviewed: Allergy & Precautions, H&P , NPO status , Patient's Chart, lab work & pertinent test results, reviewed documented beta blocker date and time   Airway Mallampati: II  TM Distance: >3 FB Neck ROM: full    Dental no notable dental hx.    Pulmonary neg pulmonary ROS, Current Smoker   Pulmonary exam normal breath sounds clear to auscultation       Cardiovascular Exercise Tolerance: Good hypertension, negative cardio ROS  Rhythm:regular Rate:Normal     Neuro/Psych negative neurological ROS  negative psych ROS   GI/Hepatic negative GI ROS, Neg liver ROS,,,  Endo/Other  negative endocrine ROS    Renal/GU negative Renal ROS  negative genitourinary   Musculoskeletal   Abdominal   Peds  Hematology negative hematology ROS (+)   Anesthesia Other Findings   Reproductive/Obstetrics negative OB ROS                             Anesthesia Physical Anesthesia Plan  ASA: 2  Anesthesia Plan: General   Post-op Pain Management:    Induction:   PONV Risk Score and Plan: Propofol infusion  Airway Management Planned:   Additional Equipment:   Intra-op Plan:   Post-operative Plan:   Informed Consent: I have reviewed the patients History and Physical, chart, labs and discussed the procedure including the risks, benefits and alternatives for the proposed anesthesia with the patient or authorized representative who has indicated his/her understanding and acceptance.     Dental Advisory Given  Plan Discussed with: CRNA  Anesthesia Plan Comments:        Anesthesia Quick Evaluation

## 2022-10-24 NOTE — Transfer of Care (Signed)
Immediate Anesthesia Transfer of Care Note  Patient: George Estes  Procedure(s) Performed: COLONOSCOPY WITH PROPOFOL POLYPECTOMY  Patient Location: Short Stay  Anesthesia Type:General  Level of Consciousness: awake, alert , oriented, and patient cooperative  Airway & Oxygen Therapy: Patient Spontanous Breathing  Post-op Assessment: Report given to RN, Post -op Vital signs reviewed and stable, and Patient moving all extremities X 4  Post vital signs: Reviewed and stable  Last Vitals:  Vitals Value Taken Time  BP 98/60 10/24/22 1021  Temp 36.3 C 10/24/22 1021  Pulse 62 10/24/22 1021  Resp 16 10/24/22 1021  SpO2 92 % 10/24/22 1021    Last Pain:  Vitals:   10/24/22 1021  TempSrc: Axillary  PainSc: 0-No pain      Patients Stated Pain Goal: 7 (25/48/62 [id number])  Complications: No notable events documented.

## 2022-10-26 LAB — SURGICAL PATHOLOGY

## 2022-10-26 NOTE — Anesthesia Postprocedure Evaluation (Signed)
Anesthesia Post Note  Patient: George Estes  Procedure(s) Performed: COLONOSCOPY WITH PROPOFOL POLYPECTOMY  Patient location during evaluation: Phase II Anesthesia Type: General Level of consciousness: awake Pain management: pain level controlled Vital Signs Assessment: post-procedure vital signs reviewed and stable Respiratory status: spontaneous breathing and respiratory function stable Cardiovascular status: blood pressure returned to baseline and stable Postop Assessment: no headache and no apparent nausea or vomiting Anesthetic complications: no Comments: Late entry   No notable events documented.   Last Vitals:  Vitals:   10/24/22 1021 10/24/22 1025  BP: 98/60 101/70  Pulse: 62 60  Resp: 16 16  Temp: (!) 36.3 C   SpO2: 92% 98%    Last Pain:  Vitals:   10/24/22 1029  TempSrc:   PainSc: 0-No pain                 Louann Sjogren

## 2022-10-31 ENCOUNTER — Encounter (HOSPITAL_COMMUNITY): Payer: Self-pay | Admitting: Gastroenterology

## 2022-10-31 NOTE — Addendum Note (Signed)
Addendum  created 10/31/22 1400 by Karna Dupes, CRNA   Intraprocedure Staff edited
